# Patient Record
Sex: Male | Born: 1945 | Race: White | Hispanic: No | Marital: Married | Smoking: Never smoker
Health system: Southern US, Community
[De-identification: ages and names within clinical notes are randomized; demographics above are authoritative.]

## PROBLEM LIST (undated history)

## (undated) DIAGNOSIS — N289 Disorder of kidney and ureter, unspecified: Secondary | ICD-10-CM

## (undated) DIAGNOSIS — I10 Essential (primary) hypertension: Secondary | ICD-10-CM

## (undated) DIAGNOSIS — E785 Hyperlipidemia, unspecified: Secondary | ICD-10-CM

## (undated) DIAGNOSIS — I2510 Atherosclerotic heart disease of native coronary artery without angina pectoris: Secondary | ICD-10-CM

## (undated) DIAGNOSIS — E119 Type 2 diabetes mellitus without complications: Secondary | ICD-10-CM

## (undated) DIAGNOSIS — N186 End stage renal disease: Secondary | ICD-10-CM

## (undated) DIAGNOSIS — I219 Acute myocardial infarction, unspecified: Secondary | ICD-10-CM

## (undated) HISTORY — PX: OTHER SURGICAL HISTORY: SHX169

## (undated) HISTORY — PX: CARDIAC CATHETERIZATION: SHX172

---

## 2008-06-25 ENCOUNTER — Ambulatory Visit: Payer: Self-pay | Admitting: Family Medicine

## 2008-10-17 ENCOUNTER — Ambulatory Visit: Payer: Self-pay | Admitting: Internal Medicine

## 2013-09-23 DIAGNOSIS — Z85828 Personal history of other malignant neoplasm of skin: Secondary | ICD-10-CM | POA: Insufficient documentation

## 2017-11-23 ENCOUNTER — Encounter: Payer: Self-pay | Admitting: *Deleted

## 2017-11-23 ENCOUNTER — Encounter: Payer: No Typology Code available for payment source | Attending: Internal Medicine | Admitting: *Deleted

## 2017-11-23 VITALS — Ht 69.4 in | Wt 205.3 lb

## 2017-11-23 DIAGNOSIS — I214 Non-ST elevation (NSTEMI) myocardial infarction: Secondary | ICD-10-CM | POA: Insufficient documentation

## 2017-11-23 DIAGNOSIS — Z955 Presence of coronary angioplasty implant and graft: Secondary | ICD-10-CM | POA: Diagnosis not present

## 2017-11-23 DIAGNOSIS — Z79899 Other long term (current) drug therapy: Secondary | ICD-10-CM | POA: Diagnosis not present

## 2017-11-23 DIAGNOSIS — Z794 Long term (current) use of insulin: Secondary | ICD-10-CM | POA: Insufficient documentation

## 2017-11-23 DIAGNOSIS — K219 Gastro-esophageal reflux disease without esophagitis: Secondary | ICD-10-CM | POA: Insufficient documentation

## 2017-11-23 DIAGNOSIS — G4730 Sleep apnea, unspecified: Secondary | ICD-10-CM | POA: Insufficient documentation

## 2017-11-23 DIAGNOSIS — E119 Type 2 diabetes mellitus without complications: Secondary | ICD-10-CM | POA: Insufficient documentation

## 2017-11-23 DIAGNOSIS — I10 Essential (primary) hypertension: Secondary | ICD-10-CM | POA: Insufficient documentation

## 2017-11-23 DIAGNOSIS — N184 Chronic kidney disease, stage 4 (severe): Secondary | ICD-10-CM | POA: Insufficient documentation

## 2017-11-23 DIAGNOSIS — E781 Pure hyperglyceridemia: Secondary | ICD-10-CM | POA: Insufficient documentation

## 2017-11-23 DIAGNOSIS — I2510 Atherosclerotic heart disease of native coronary artery without angina pectoris: Secondary | ICD-10-CM | POA: Insufficient documentation

## 2017-11-23 NOTE — Progress Notes (Signed)
Cardiac Individual Treatment Plan  Patient Details  Name: Todd Barr MRN: [id number] Date of Birth: Jul 11, 1945 Referring Provider:     Cardiac Rehab from 11/23/2017 in [facility] Cardiac and Pulmonary Rehab  Referring Provider  Barbie Haggis MD ([place])      Initial Encounter Date:    Cardiac Rehab from 11/23/2017 in [facility] Cardiac and Pulmonary Rehab  Date  11/23/17      Visit Diagnosis: NSTEMI (non-ST elevated myocardial infarction) [facility])  Status post coronary artery stent placement  Patient's Home Medications on Admission:  Current Outpatient Medications:  .  amLODipine (NORVASC) 10 MG tablet, Take 10 mg by mouth daily., Disp: , Rfl:  .  aspirin 81 MG chewable tablet, Chew by mouth., Disp: , Rfl:  .  atorvastatin (LIPITOR) 80 MG tablet, Take 80 mg by mouth at bedtime., Disp: , Rfl:  .  calcitRIOL (ROCALTROL) 0.25 MCG capsule, Take 0.25 mcg by mouth daily., Disp: , Rfl:  .  calcium acetate (PHOSLO) 667 MG capsule, Take 1,334 mg by mouth 2 (two) times daily with a meal., Disp: , Rfl:  .  Cholecalciferol 1000 units tablet, Take 1,000 Units by mouth daily., Disp: , Rfl:  .  clopidogrel (PLAVIX) 75 MG tablet, Take 75 mg by mouth daily., Disp: , Rfl:  .  ferrous sulfate 325 (65 FE) MG EC tablet, Take 325 mg by mouth 3 (three) times daily with meals., Disp: , Rfl:  .  glucose blood test strip, 1 each by Other route as needed for other. Use as instructed, Disp: , Rfl:  .  hydrALAZINE (APRESOLINE) 50 MG tablet, Take 50 mg by mouth 3 (three) times daily., Disp: , Rfl:  .  insulin glargine (LANTUS) 100 UNIT/ML injection, Inject 25 Units into the skin 2 (two) times daily., Disp: , Rfl:  .  insulin regular (NOVOLIN R,HUMULIN R) 100 units/mL injection, Inject 15 Units into the skin 2 (two) times daily before a meal., Disp: , Rfl:  .  isosorbide mononitrate (IMDUR) 60 MG 24 hr tablet, Take 60 mg by mouth every morning., Disp: , Rfl:  .  magnesium oxide (MAG-OX) 400 MG tablet, Take 400 mg by mouth daily.,  Disp: , Rfl:  .  metoprolol (TOPROL-XL) 200 MG 24 hr tablet, Take 100 mg by mouth daily., Disp: , Rfl:  .  nitroGLYCERIN (NITROSTAT) 0.4 MG SL tablet, Place 0.4 mg under the tongue every 5 (five) minutes as needed for chest pain., Disp: , Rfl:  .  omeprazole (PRILOSEC) 20 MG capsule, Take 20 mg by mouth every morning., Disp: , Rfl:  .  ranitidine (ZANTAC) 150 MG tablet, Take 150 mg by mouth 2 (two) times daily as needed for heartburn., Disp: , Rfl:  .  simethicone (MYLICON) 80 MG chewable tablet, Chew 80 mg by mouth 3 (three) times daily as needed for flatulence., Disp: , Rfl:  .  sodium bicarbonate 650 MG tablet, Take 650 mg by mouth 2 (two) times daily., Disp: , Rfl:   Past Medical History: History reviewed. No pertinent past medical history.  Tobacco Use: Social History   Tobacco Use  Smoking Status Never Smoker  Smokeless Tobacco Never Used    Labs: Recent Review Flowsheet Data    There is no flowsheet data to display.       Exercise Target Goals: Exercise Program Goal: Individual exercise prescription set using results from initial 6 min walk test and THRR while considering  patient's activity barriers and safety.   Exercise Prescription Goal: Initial exercise prescription builds  to 30-45 minutes a day of aerobic activity, 2-3 days per week.  Home exercise guidelines will be given to patient during program as part of exercise prescription that the participant will acknowledge.  Activity Barriers & Risk Stratification: Activity Barriers & Cardiac Risk Stratification - 11/23/17 1251      Activity Barriers & Cardiac Risk Stratification   Activity Barriers  Arthritis;Neck/Spine Problems;Deconditioning;Muscular Weakness    Cardiac Risk Stratification  High       6 Minute Walk: 6 Minute Walk    Row Name 11/23/17 1427         6 Minute Walk   Phase  Initial     Distance  1115 feet     Walk Time  6 minutes     # of Rest Breaks  0     MPH  2.11     METS  2.89     RPE   12     VO2 Peak  10.09     Symptoms  No     Resting HR  75 bpm     Resting BP  146/60     Resting Oxygen Saturation   99 %     Exercise Oxygen Saturation  during 6 min walk  97 %     Max Ex. HR  119 bpm     Max Ex. BP  158/84     2 Minute Post BP  134/64        Oxygen Initial Assessment:   Oxygen Re-Evaluation:   Oxygen Discharge (Final Oxygen Re-Evaluation):   Initial Exercise Prescription: Initial Exercise Prescription - 11/23/17 1400      Date of Initial Exercise RX and Referring Provider   Date  11/23/17    Referring Provider  Barbie Haggis MD ([place])      Treadmill   MPH  2    Grade  0.5    Minutes  15    METs  2.67      NuStep   Level  2    SPM  80    Minutes  15    METs  2      Biostep-RELP   Level  2    SPM  50    Minutes  15    METs  2      Prescription Details   Frequency (times per week)  2    Duration  Progress to 30 minutes of continuous aerobic without signs/symptoms of physical distress      Intensity   THRR 40-80% of Max Heartrate  105-135    Ratings of Perceived Exertion  11-13    Perceived Dyspnea  0-4      Progression   Progression  Continue to progress workloads to maintain intensity without signs/symptoms of physical distress.      Resistance Training   Training Prescription  Yes    Weight  3 lbs    Reps  10-15       Perform Capillary Blood Glucose checks as needed.  Exercise Prescription Changes: Exercise Prescription Changes    Row Name 11/23/17 1300             Response to Exercise   Blood Pressure (Admit)  146/60       Blood Pressure (Exercise)  158/84       Blood Pressure (Exit)  134/64       Heart Rate (Admit)  75 bpm       Heart Rate (Exercise)  119 bpm  Heart Rate (Exit)  81 bpm       Oxygen Saturation (Admit)  99 %       Oxygen Saturation (Exercise)  97 %       Rating of Perceived Exertion (Exercise)  12       Symptoms  none       Comments  walk test results          Exercise  Comments:   Exercise Goals and Review: Exercise Goals    Row Name 11/23/17 1430             Exercise Goals   Increase Physical Activity  Yes       Intervention  Provide advice, education, support and counseling about physical activity/exercise needs.;Develop an individualized exercise prescription for aerobic and resistive training based on initial evaluation findings, risk stratification, comorbidities and participant's personal goals.       Expected Outcomes  Short Term: Attend rehab on a regular basis to increase amount of physical activity.;Long Term: Add in home exercise to make exercise part of routine and to increase amount of physical activity.;Long Term: Exercising regularly at least 3-5 days a week.       Increase Strength and Stamina  Yes       Intervention  Provide advice, education, support and counseling about physical activity/exercise needs.;Develop an individualized exercise prescription for aerobic and resistive training based on initial evaluation findings, risk stratification, comorbidities and participant's personal goals.       Expected Outcomes  Short Term: Increase workloads from initial exercise prescription for resistance, speed, and METs.;Short Term: Perform resistance training exercises routinely during rehab and add in resistance training at home;Long Term: Improve cardiorespiratory fitness, muscular endurance and strength as measured by increased METs and functional capacity (6MWT)       Able to understand and use rate of perceived exertion (RPE) scale  Yes       Intervention  Provide education and explanation on how to use RPE scale       Expected Outcomes  Short Term: Able to use RPE daily in rehab to express subjective intensity level;Long Term:  Able to use RPE to guide intensity level when exercising independently       Knowledge and understanding of Target Heart Rate Range (THRR)  Yes       Intervention  Provide education and explanation of THRR including how  the numbers were predicted and where they are located for reference       Expected Outcomes  Short Term: Able to use daily as guideline for intensity in rehab;Long Term: Able to use THRR to govern intensity when exercising independently;Short Term: Able to state/look up THRR       Able to check pulse independently  Yes       Intervention  Review the importance of being able to check your own pulse for safety during independent exercise;Provide education and demonstration on how to check pulse in carotid and radial arteries.       Expected Outcomes  Short Term: Able to explain why pulse checking is important during independent exercise;Long Term: Able to check pulse independently and accurately       Understanding of Exercise Prescription  Yes       Intervention  Provide education, explanation, and written materials on patient's individual exercise prescription       Expected Outcomes  Short Term: Able to explain program exercise prescription;Long Term: Able to explain home exercise prescription to exercise independently  Exercise Goals Re-Evaluation :   Discharge Exercise Prescription (Final Exercise Prescription Changes): Exercise Prescription Changes - 11/23/17 1300      Response to Exercise   Blood Pressure (Admit)  146/60    Blood Pressure (Exercise)  158/84    Blood Pressure (Exit)  134/64    Heart Rate (Admit)  75 bpm    Heart Rate (Exercise)  119 bpm    Heart Rate (Exit)  81 bpm    Oxygen Saturation (Admit)  99 %    Oxygen Saturation (Exercise)  97 %    Rating of Perceived Exertion (Exercise)  12    Symptoms  none    Comments  walk test results       Nutrition:  Target Goals: Understanding of nutrition guidelines, daily intake of sodium <155m, cholesterol <2082m calories 30% from fat and 7% or less from saturated fats, daily to have 5 or more servings of fruits and vegetables.  Biometrics:    Nutrition Therapy Plan and Nutrition Goals: Nutrition Therapy &  Goals - 11/23/17 1254      Intervention Plan   Intervention  Prescribe, educate and counsel regarding individualized specific dietary modifications aiming towards targeted core components such as weight, hypertension, lipid management, diabetes, heart failure and other comorbidities.;Nutrition handout(s) given to patient.    Expected Outcomes  Short Term Goal: Understand basic principles of dietary content, such as calories, fat, sodium, cholesterol and nutrients.;Short Term Goal: A plan has been developed with personal nutrition goals set during dietitian appointment.;Long Term Goal: Adherence to prescribed nutrition plan.       Nutrition Assessments: Nutrition Assessments - 11/23/17 1254      MEDFICTS Scores   Pre Score  52       Nutrition Goals Re-Evaluation:   Nutrition Goals Discharge (Final Nutrition Goals Re-Evaluation):   Psychosocial: Target Goals: Acknowledge presence or absence of significant depression and/or stress, maximize coping skills, provide positive support system. Participant is able to verbalize types and ability to use techniques and skills needed for reducing stress and depression.   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 This has really been hard. He reports a lot of his issues he does not know if it is coming from his recent heart attack or his failing kindeys.       Family Dynamics   Good Support System?  Yes   spouse     Screening Interventions   Interventions  Encouraged to exercise;Program counselor consult;To provide support and resources with identified psychosocial needs;Provide feedback about the scores to  participant    Expected Outcomes  Short Term goal: Utilizing psychosocial counselor, staff and physician to assist with identification of specific Stressors or current issues interfering with healing process. Setting desired goal for each stressor or current issue identified.;Long Term Goal: Stressors or current issues are controlled or eliminated.;Short Term goal: Identification and review with participant of any Quality of Life or Depression concerns found by scoring the questionnaire.;Long Term goal: The participant improves quality of Life and PHQ9 Scores as seen by post scores and/or verbalization of changes       Quality of Life Scores:  Quality of Life - 11/23/17 1253      Quality of Life   Select  Quality of Life      Quality of Life Scores   Health/Function Pre  19.33 %    Socioeconomic Pre  20.63 %    Psych/Spiritual Pre  24.29 %    Family Pre  30 %    GLOBAL Pre  22.14 %      Scores of 19 and below usually indicate a poorer quality of life in these areas.  A difference of  2-3 points is a clinically meaningful difference.  A difference of 2-3 points in the total score of the Quality of Life Index has been associated with significant improvement in overall quality of life, self-image, physical symptoms, and general health in studies assessing change in quality of life.  PHQ-9: Recent Review Flowsheet Data    Depression screen [facility] 2/9 11/23/2017   Decreased Interest 0   Down, Depressed, Hopeless 0   PHQ - 2 Score 0   Altered sleeping 0   Tired, decreased energy 2   Change in appetite 0   Feeling bad or failure about yourself  0   Trouble concentrating 0   Moving slowly or fidgety/restless 0   Suicidal thoughts 0   PHQ-9 Score 2   Difficult doing work/chores Somewhat difficult     Interpretation of Total Score  Total Score Depression Severity:  1-4 = Minimal depression, 5-9 = Mild depression, 10-14 = Moderate depression, 15-19 = Moderately severe depression, 20-27 =  Severe depression   Psychosocial Evaluation and Intervention:   Psychosocial Re-Evaluation:   Psychosocial Discharge (Final Psychosocial Re-Evaluation):   Vocational Rehabilitation: Provide vocational rehab assistance to qualifying candidates.   Vocational Rehab Evaluation & Intervention: Vocational Rehab - 11/23/17 1255      Initial Vocational Rehab Evaluation & Intervention   Assessment shows need for Vocational Rehabilitation  No       Education: Education Goals: Education classes will be provided on a variety of topics geared toward better understanding of heart health and risk factor modification. Participant will state understanding/return demonstration of topics presented as noted by education test scores.  Learning Barriers/Preferences: Learning Barriers/Preferences - 11/23/17 1254      Learning Barriers/Preferences   Learning Barriers  None    Learning Preferences  Individual Instruction;Written Material       Education Topics:  AED/CPR: - Group verbal and written instruction with the use of models to demonstrate the basic use of the AED with the basic ABC's of resuscitation.   General Nutrition Guidelines/Fats and Fiber: -Group instruction provided by verbal, written material, models and posters to present the general guidelines for heart healthy nutrition. Gives an explanation and review of dietary fats and fiber.   Controlling Sodium/Reading Food Labels: -Group verbal and written material supporting the discussion of sodium use in heart healthy nutrition. Review and explanation with models, verbal and written materials for utilization of the food label.   Exercise Physiology & General Exercise Guidelines: - Group verbal and written instruction with models to review the exercise physiology of the cardiovascular system and associated critical values. Provides general exercise guidelines with specific guidelines to those with heart or lung disease.     Aerobic Exercise & Resistance Training: - Gives group verbal and written instruction on the various components of exercise. Focuses on aerobic and resistive training programs and the benefits of this training and how to safely progress through these programs..   Flexibility, Balance, Mind/Body Relaxation:  Provides group verbal/written instruction on the benefits of flexibility and balance training, including mind/body exercise modes such as yoga, pilates and tai chi.  Demonstration and skill practice provided.   Stress and Anxiety: - Provides group verbal and written instruction about the health risks of elevated stress and causes of high stress.  Discuss the correlation between heart/lung disease and anxiety and treatment options. Review healthy ways to manage with stress and anxiety.   Depression: - Provides group verbal and written instruction on the correlation between heart/lung disease and depressed mood, treatment options, and the stigmas associated with seeking treatment.   Anatomy & Physiology of the Heart: - Group verbal and written instruction and models provide basic cardiac anatomy and physiology, with the coronary electrical and arterial systems. Review of Valvular disease and Heart Failure   Cardiac Procedures: - Group verbal and written instruction to review commonly prescribed medications for heart disease. Reviews the medication, class of the drug, and side effects. Includes the steps to properly store meds and maintain the prescription regimen. (beta blockers and nitrates)   Cardiac Medications I: - Group verbal and written instruction to review commonly prescribed medications for heart disease. Reviews the medication, class of the drug, and side effects. Includes the steps to properly store meds and maintain the prescription regimen.   Cardiac Medications II: -Group verbal and written instruction to review commonly prescribed medications for heart disease.  Reviews the medication, class of the drug, and side effects. (all other drug classes)    Go Sex-Intimacy & Heart Disease, Get SMART - Goal Setting: - Group verbal and written instruction through game format to discuss heart disease and the return to sexual intimacy. Provides group verbal and written material to discuss and apply goal setting through the application of the S.M.A.R.T. Method.   Other Matters of the Heart: - Provides group verbal, written materials and models to describe Stable Angina and Peripheral Artery. Includes description of the disease process and treatment options available to the cardiac patient.   Exercise & Equipment Safety: - Individual verbal instruction and demonstration of equipment use and safety with use of the equipment.   Cardiac Rehab from 11/23/2017 in [facility] Cardiac and Pulmonary Rehab  Date  11/23/17  Educator  [facility]  Instruction Review Code  1- Verbalizes Understanding      Infection Prevention: - Provides verbal and written material to individual with discussion of infection control including proper hand washing and proper equipment cleaning during exercise session.   Cardiac Rehab from 11/23/2017 in [facility] Cardiac and Pulmonary Rehab  Date  11/23/17  Educator  [facility]  Instruction Review Code  1- Verbalizes Understanding      Falls Prevention: - Provides verbal and written material to individual with discussion of falls prevention and safety.   Cardiac Rehab from 11/23/2017 in [facility] Cardiac and Pulmonary Rehab  Date  11/23/17  Educator  [facility]  Instruction Review Code  1- Verbalizes Understanding      Diabetes: - Individual verbal and written instruction to review signs/symptoms of diabetes, desired ranges of glucose level fasting, after meals and with exercise. Acknowledge that pre and post exercise glucose checks will be done for 3 sessions at entry of program.   Cardiac Rehab from 11/23/2017 in [facility] Cardiac and Pulmonary Rehab  Date  11/23/17  Educator   [facility]  Instruction Review Code  1- Verbalizes Understanding      Know Your Numbers and Risk Factors: -Group verbal and written instruction about important numbers in your health.  Discussion of what are risk factors and how they play a role in the disease process.  Review of Cholesterol, Blood Pressure, Diabetes, and BMI and the role they play in your overall health.   Sleep Hygiene: -Provides group verbal and written instruction about how sleep can affect your health.  Define sleep hygiene, discuss sleep cycles and impact of sleep habits. Review good sleep hygiene tips.    Other: -Provides group and verbal instruction on various topics (see comments)   Knowledge Questionnaire Score: Knowledge Questionnaire Score - 11/23/17 1254      Knowledge Questionnaire Score   Pre Score  25/26   correct answers reviewed with patient, focus on exercise      Core Components/Risk Factors/Patient Goals at Admission: Personal Goals and Risk Factors at Admission - 11/23/17 1255      Core Components/Risk Factors/Patient Goals on Admission    Weight Management  Yes;Weight Loss    Intervention  Weight Management: Develop a combined nutrition and exercise program designed to reach desired caloric intake, while maintaining appropriate intake of nutrient and fiber, sodium and fats, and appropriate energy expenditure required for the weight goal.;Weight Management: Provide education and appropriate resources to help participant work on and attain dietary goals.;Weight Management/Obesity: Establish reasonable short term and long term weight goals.    Admit Weight  205 lb (93 kg)    Goal Weight: Short Term  200 lb (90.7 kg)    Goal Weight: Long Term  200 lb (90.7 kg)    Expected Outcomes  Short Term: Continue to assess and modify interventions until short term weight is achieved;Long Term: Adherence to nutrition and physical activity/exercise program aimed toward attainment of established weight goal;Weight  Loss: Understanding of general recommendations for a balanced deficit meal plan, which promotes 1-2 lb weight loss per week and includes a negative energy balance of [phone] kcal/d;Understanding recommendations for meals to include 15-35% energy as protein, 25-35% energy from fat, 35-60% energy from carbohydrates, less than 293m of dietary cholesterol, 20-35 gm of total fiber daily;Understanding of distribution of calorie intake throughout the day with the consumption of 4-5 meals/snacks    Diabetes  Yes    Intervention  Provide education about signs/symptoms and action to take for hypo/hyperglycemia.;Provide education about proper nutrition, including hydration, and aerobic/resistive exercise prescription along with prescribed medications to achieve blood glucose in normal ranges: Fasting glucose 65-99 mg/dL    Expected Outcomes  Short Term: Participant verbalizes understanding of the signs/symptoms and immediate care of hyper/hypoglycemia, proper foot care and importance of medication, aerobic/resistive exercise and nutrition plan for blood glucose control.;Long Term: Attainment of HbA1C < 7%.    Hypertension  Yes    Intervention  Monitor prescription use compliance.;Provide education on lifestyle modifcations including regular physical activity/exercise, weight management, moderate sodium restriction and increased consumption of fresh fruit, vegetables, and low fat dairy, alcohol moderation, and smoking cessation.    Expected Outcomes  Short Term: Continued assessment and intervention until BP is < 140/932mHG in hypertensive participants. < 130/8037mG in hypertensive participants with diabetes, heart failure or chronic kidney disease.;Long Term: Maintenance of blood pressure at goal levels.    Lipids  Yes    Intervention  Provide education and support for participant on nutrition & aerobic/resistive exercise along with prescribed medications to achieve LDL <75m56mDL >40mg3m Expected Outcomes   Short Term: Participant states understanding of desired cholesterol values and is compliant with medications prescribed. Participant is following exercise prescription and nutrition guidelines.;Long Term:  Cholesterol controlled with medications as prescribed, with individualized exercise RX and with personalized nutrition plan. Value goals: LDL < 18m, HDL > 40 mg.       Core Components/Risk Factors/Patient Goals Review:    Core Components/Risk Factors/Patient Goals at Discharge (Final Review):    ITP Comments: ITP Comments    Row Name 11/23/17 1216           ITP Comments  Med Review completed. Initial ITP created. [place]          Comments: Initial ITP

## 2017-11-23 NOTE — Patient Instructions (Signed)
Patient Instructions  Patient Details  Name: Todd Barr MRN: [id number] Date of Birth: April 22, 1945 Referring Provider:  Mliss Sax, MD  Below are your personal goals for exercise, nutrition, and risk factors. Our goal is to help you stay on track towards obtaining and maintaining these goals. We will be discussing your progress on these goals with you throughout the program.  Initial Exercise Prescription: Initial Exercise Prescription - 11/23/17 1400      Date of Initial Exercise RX and Referring Provider   Date  11/23/17    Referring Provider  Barbie Haggis [place] ([facility])      Treadmill   MPH  2    Grade  0.5    Minutes  15    METs  2.67      NuStep   Level  2    SPM  80    Minutes  15    METs  2      Biostep-RELP   Level  2    SPM  50    Minutes  15    METs  2      Prescription Details   Frequency (times per week)  2    Duration  Progress to 30 minutes of continuous aerobic without signs/symptoms of physical distress      Intensity   THRR 40-80% of Max Heartrate  105-135    Ratings of Perceived Exertion  11-13    Perceived Dyspnea  0-4      Progression   Progression  Continue to progress workloads to maintain intensity without signs/symptoms of physical distress.      Resistance Training   Training Prescription  Yes    Weight  3 lbs    Reps  10-15       Exercise Goals: Frequency: Be able to perform aerobic exercise two to three times per week in program working toward 2-5 days per week of home exercise.  Intensity: Work with a perceived exertion of 11 (fairly light) - 15 (hard) while following your exercise prescription.  We will make changes to your prescription with you as you progress through the program.   Duration: Be able to do 30 to 45 minutes of continuous aerobic exercise in addition to a 5 minute warm-up and a 5 minute cool-down routine.   Nutrition Goals: Your personal nutrition goals will be established when you do your nutrition analysis with  the dietician.  The following are general nutrition guidelines to follow: Cholesterol < 200mg /day Sodium < 1500mg /day Fiber: Men over 50 yrs - 30 grams per day  Personal Goals: Personal Goals and Risk Factors at Admission - 11/23/17 1255      Core Components/Risk Factors/Patient Goals on Admission    Weight Management  Yes;Weight Loss    Intervention  Weight Management: Develop a combined nutrition and exercise program designed to reach desired caloric intake, while maintaining appropriate intake of nutrient and fiber, sodium and fats, and appropriate energy expenditure required for the weight goal.;Weight Management: Provide education and appropriate resources to help participant work on and attain dietary goals.;Weight Management/Obesity: Establish reasonable short term and long term weight goals.    Admit Weight  205 lb (93 kg)    Goal Weight: Short Term  200 lb (90.7 kg)    Goal Weight: Long Term  200 lb (90.7 kg)    Expected Outcomes  Short Term: Continue to assess and modify interventions until short term weight is achieved;Long Term: Adherence to nutrition and physical activity/exercise program aimed  toward attainment of established weight goal;Weight Loss: Understanding of general recommendations for a balanced deficit meal plan, which promotes 1-2 lb weight loss per week and includes a negative energy balance of [phone] kcal/d;Understanding recommendations for meals to include 15-35% energy as protein, 25-35% energy from fat, 35-60% energy from carbohydrates, less than 200mg  of dietary cholesterol, 20-35 gm of total fiber daily;Understanding of distribution of calorie intake throughout the day with the consumption of 4-5 meals/snacks    Diabetes  Yes    Intervention  Provide education about signs/symptoms and action to take for hypo/hyperglycemia.;Provide education about proper nutrition, including hydration, and aerobic/resistive exercise prescription along with prescribed medications to  achieve blood glucose in normal ranges: Fasting glucose 65-99 mg/dL    Expected Outcomes  Short Term: Participant verbalizes understanding of the signs/symptoms and immediate care of hyper/hypoglycemia, proper foot care and importance of medication, aerobic/resistive exercise and nutrition plan for blood glucose control.;Long Term: Attainment of HbA1C < 7%.    Hypertension  Yes    Intervention  Monitor prescription use compliance.;Provide education on lifestyle modifcations including regular physical activity/exercise, weight management, moderate sodium restriction and increased consumption of fresh fruit, vegetables, and low fat dairy, alcohol moderation, and smoking cessation.    Expected Outcomes  Short Term: Continued assessment and intervention until BP is < 140/10mm HG in hypertensive participants. < 130/28mm HG in hypertensive participants with diabetes, heart failure or chronic kidney disease.;Long Term: Maintenance of blood pressure at goal levels.    Lipids  Yes    Intervention  Provide education and support for participant on nutrition & aerobic/resistive exercise along with prescribed medications to achieve LDL 70mg , HDL >40mg .    Expected Outcomes  Short Term: Participant states understanding of desired cholesterol values and is compliant with medications prescribed. Participant is following exercise prescription and nutrition guidelines.;Long Term: Cholesterol controlled with medications as prescribed, with individualized exercise RX and with personalized nutrition plan. Value goals: LDL < 70mg , HDL > 40 mg.       Tobacco Use Initial Evaluation: Social History   Tobacco Use  Smoking Status Never Smoker  Smokeless Tobacco Never Used    Exercise Goals and Review: Exercise Goals    Row Name 11/23/17 1430             Exercise Goals   Increase Physical Activity  Yes       Intervention  Provide advice, education, support and counseling about physical activity/exercise  needs.;Develop an individualized exercise prescription for aerobic and resistive training based on initial evaluation findings, risk stratification, comorbidities and participant's personal goals.       Expected Outcomes  Short Term: Attend rehab on a regular basis to increase amount of physical activity.;Long Term: Add in home exercise to make exercise part of routine and to increase amount of physical activity.;Long Term: Exercising regularly at least 3-5 days a week.       Increase Strength and Stamina  Yes       Intervention  Provide advice, education, support and counseling about physical activity/exercise needs.;Develop an individualized exercise prescription for aerobic and resistive training based on initial evaluation findings, risk stratification, comorbidities and participant's personal goals.       Expected Outcomes  Short Term: Increase workloads from initial exercise prescription for resistance, speed, and METs.;Short Term: Perform resistance training exercises routinely during rehab and add in resistance training at home;Long Term: Improve cardiorespiratory fitness, muscular endurance and strength as measured by increased METs and functional capacity (6MWT)  Able to understand and use rate of perceived exertion (RPE) scale  Yes       Intervention  Provide education and explanation on how to use RPE scale       Expected Outcomes  Short Term: Able to use RPE daily in rehab to express subjective intensity level;Long Term:  Able to use RPE to guide intensity level when exercising independently       Knowledge and understanding of Target Heart Rate Range (THRR)  Yes       Intervention  Provide education and explanation of THRR including how the numbers were predicted and where they are located for reference       Expected Outcomes  Short Term: Able to use daily as guideline for intensity in rehab;Long Term: Able to use THRR to govern intensity when exercising independently;Short Term: Able  to state/look up THRR       Able to check pulse independently  Yes       Intervention  Review the importance of being able to check your own pulse for safety during independent exercise;Provide education and demonstration on how to check pulse in carotid and radial arteries.       Expected Outcomes  Short Term: Able to explain why pulse checking is important during independent exercise;Long Term: Able to check pulse independently and accurately       Understanding of Exercise Prescription  Yes       Intervention  Provide education, explanation, and written materials on patient's individual exercise prescription       Expected Outcomes  Short Term: Able to explain program exercise prescription;Long Term: Able to explain home exercise prescription to exercise independently          Copy of goals given to participant.

## 2017-11-25 ENCOUNTER — Encounter: Payer: Self-pay | Admitting: *Deleted

## 2017-11-25 DIAGNOSIS — I214 Non-ST elevation (NSTEMI) myocardial infarction: Secondary | ICD-10-CM

## 2017-11-25 DIAGNOSIS — Z955 Presence of coronary angioplasty implant and graft: Secondary | ICD-10-CM

## 2017-11-25 NOTE — Progress Notes (Signed)
Cardiac Individual Treatment Plan  Patient Details  Name: Todd Barr MRN: [id number] Date of Birth: 24-Jan-1946 Referring Provider:     Cardiac Rehab from 11/23/2017 in [facility] Cardiac and Pulmonary Rehab  Referring Provider  Barbie Haggis MD ([place])      Initial Encounter Date:    Cardiac Rehab from 11/23/2017 in [facility] Cardiac and Pulmonary Rehab  Date  11/23/17      Visit Diagnosis: NSTEMI (non-ST elevated myocardial infarction) [facility])  Status post coronary artery stent placement  Patient's Home Medications on Admission:  Current Outpatient Medications:  .  amLODipine (NORVASC) 10 MG tablet, Take 10 mg by mouth daily., Disp: , Rfl:  .  aspirin 81 MG chewable tablet, Chew by mouth., Disp: , Rfl:  .  atorvastatin (LIPITOR) 80 MG tablet, Take 80 mg by mouth at bedtime., Disp: , Rfl:  .  calcitRIOL (ROCALTROL) 0.25 MCG capsule, Take 0.25 mcg by mouth daily., Disp: , Rfl:  .  calcium acetate (PHOSLO) 667 MG capsule, Take 1,334 mg by mouth 2 (two) times daily with a meal., Disp: , Rfl:  .  Cholecalciferol 1000 units tablet, Take 1,000 Units by mouth daily., Disp: , Rfl:  .  clopidogrel (PLAVIX) 75 MG tablet, Take 75 mg by mouth daily., Disp: , Rfl:  .  ferrous sulfate 325 (65 FE) MG EC tablet, Take 325 mg by mouth 3 (three) times daily with meals., Disp: , Rfl:  .  glucose blood test strip, 1 each by Other route as needed for other. Use as instructed, Disp: , Rfl:  .  hydrALAZINE (APRESOLINE) 50 MG tablet, Take 50 mg by mouth 3 (three) times daily., Disp: , Rfl:  .  insulin glargine (LANTUS) 100 UNIT/ML injection, Inject 25 Units into the skin 2 (two) times daily., Disp: , Rfl:  .  insulin regular (NOVOLIN R,HUMULIN R) 100 units/mL injection, Inject 15 Units into the skin 2 (two) times daily before a meal., Disp: , Rfl:  .  isosorbide mononitrate (IMDUR) 60 MG 24 hr tablet, Take 60 mg by mouth every morning., Disp: , Rfl:  .  magnesium oxide (MAG-OX) 400 MG tablet, Take 400 mg by mouth daily.,  Disp: , Rfl:  .  metoprolol (TOPROL-XL) 200 MG 24 hr tablet, Take 100 mg by mouth daily., Disp: , Rfl:  .  nitroGLYCERIN (NITROSTAT) 0.4 MG SL tablet, Place 0.4 mg under the tongue every 5 (five) minutes as needed for chest pain., Disp: , Rfl:  .  omeprazole (PRILOSEC) 20 MG capsule, Take 20 mg by mouth every morning., Disp: , Rfl:  .  ranitidine (ZANTAC) 150 MG tablet, Take 150 mg by mouth 2 (two) times daily as needed for heartburn., Disp: , Rfl:  .  simethicone (MYLICON) 80 MG chewable tablet, Chew 80 mg by mouth 3 (three) times daily as needed for flatulence., Disp: , Rfl:  .  sodium bicarbonate 650 MG tablet, Take 650 mg by mouth 2 (two) times daily., Disp: , Rfl:   Past Medical History: No past medical history on file.  Tobacco Use: Social History   Tobacco Use  Smoking Status Never Smoker  Smokeless Tobacco Never Used    Labs: Recent Review Flowsheet Data    There is no flowsheet data to display.       Exercise Target Goals: Exercise Program Goal: Individual exercise prescription set using results from initial 6 min walk test and THRR while considering  patient's activity barriers and safety.   Exercise Prescription Goal: Initial exercise prescription builds to  30-45 minutes a day of aerobic activity, 2-3 days per week.  Home exercise guidelines will be given to patient during program as part of exercise prescription that the participant will acknowledge.  Activity Barriers & Risk Stratification: Activity Barriers & Cardiac Risk Stratification - 11/23/17 1251      Activity Barriers & Cardiac Risk Stratification   Activity Barriers  Arthritis;Neck/Spine Problems;Deconditioning;Muscular Weakness    Cardiac Risk Stratification  High       6 Minute Walk: 6 Minute Walk    Row Name 11/23/17 1427         6 Minute Walk   Phase  Initial     Distance  1115 feet     Walk Time  6 minutes     # of Rest Breaks  0     MPH  2.11     METS  2.89     RPE  12     VO2 Peak   10.09     Symptoms  No     Resting HR  75 bpm     Resting BP  146/60     Resting Oxygen Saturation   99 %     Exercise Oxygen Saturation  during 6 min walk  97 %     Max Ex. HR  119 bpm     Max Ex. BP  158/84     2 Minute Post BP  134/64        Oxygen Initial Assessment:   Oxygen Re-Evaluation:   Oxygen Discharge (Final Oxygen Re-Evaluation):   Initial Exercise Prescription: Initial Exercise Prescription - 11/23/17 1400      Date of Initial Exercise RX and Referring Provider   Date  11/23/17    Referring Provider  Barbie Haggis MD ([place])      Treadmill   MPH  2    Grade  0.5    Minutes  15    METs  2.67      NuStep   Level  2    SPM  80    Minutes  15    METs  2      Biostep-RELP   Level  2    SPM  50    Minutes  15    METs  2      Prescription Details   Frequency (times per week)  2    Duration  Progress to 30 minutes of continuous aerobic without signs/symptoms of physical distress      Intensity   THRR 40-80% of Max Heartrate  105-135    Ratings of Perceived Exertion  11-13    Perceived Dyspnea  0-4      Progression   Progression  Continue to progress workloads to maintain intensity without signs/symptoms of physical distress.      Resistance Training   Training Prescription  Yes    Weight  3 lbs    Reps  10-15       Perform Capillary Blood Glucose checks as needed.  Exercise Prescription Changes: Exercise Prescription Changes    Row Name 11/23/17 1300             Response to Exercise   Blood Pressure (Admit)  146/60       Blood Pressure (Exercise)  158/84       Blood Pressure (Exit)  134/64       Heart Rate (Admit)  75 bpm       Heart Rate (Exercise)  119 bpm  Heart Rate (Exit)  81 bpm       Oxygen Saturation (Admit)  99 %       Oxygen Saturation (Exercise)  97 %       Rating of Perceived Exertion (Exercise)  12       Symptoms  none       Comments  walk test results          Exercise Comments:   Exercise Goals and  Review: Exercise Goals    Row Name 11/23/17 1430             Exercise Goals   Increase Physical Activity  Yes       Intervention  Provide advice, education, support and counseling about physical activity/exercise needs.;Develop an individualized exercise prescription for aerobic and resistive training based on initial evaluation findings, risk stratification, comorbidities and participant's personal goals.       Expected Outcomes  Short Term: Attend rehab on a regular basis to increase amount of physical activity.;Long Term: Add in home exercise to make exercise part of routine and to increase amount of physical activity.;Long Term: Exercising regularly at least 3-5 days a week.       Increase Strength and Stamina  Yes       Intervention  Provide advice, education, support and counseling about physical activity/exercise needs.;Develop an individualized exercise prescription for aerobic and resistive training based on initial evaluation findings, risk stratification, comorbidities and participant's personal goals.       Expected Outcomes  Short Term: Increase workloads from initial exercise prescription for resistance, speed, and METs.;Short Term: Perform resistance training exercises routinely during rehab and add in resistance training at home;Long Term: Improve cardiorespiratory fitness, muscular endurance and strength as measured by increased METs and functional capacity (6MWT)       Able to understand and use rate of perceived exertion (RPE) scale  Yes       Intervention  Provide education and explanation on how to use RPE scale       Expected Outcomes  Short Term: Able to use RPE daily in rehab to express subjective intensity level;Long Term:  Able to use RPE to guide intensity level when exercising independently       Knowledge and understanding of Target Heart Rate Range (THRR)  Yes       Intervention  Provide education and explanation of THRR including how the numbers were predicted and  where they are located for reference       Expected Outcomes  Short Term: Able to use daily as guideline for intensity in rehab;Long Term: Able to use THRR to govern intensity when exercising independently;Short Term: Able to state/look up THRR       Able to check pulse independently  Yes       Intervention  Review the importance of being able to check your own pulse for safety during independent exercise;Provide education and demonstration on how to check pulse in carotid and radial arteries.       Expected Outcomes  Short Term: Able to explain why pulse checking is important during independent exercise;Long Term: Able to check pulse independently and accurately       Understanding of Exercise Prescription  Yes       Intervention  Provide education, explanation, and written materials on patient's individual exercise prescription       Expected Outcomes  Short Term: Able to explain program exercise prescription;Long Term: Able to explain home exercise prescription to exercise independently  Exercise Goals Re-Evaluation :   Discharge Exercise Prescription (Final Exercise Prescription Changes): Exercise Prescription Changes - 11/23/17 1300      Response to Exercise   Blood Pressure (Admit)  146/60    Blood Pressure (Exercise)  158/84    Blood Pressure (Exit)  134/64    Heart Rate (Admit)  75 bpm    Heart Rate (Exercise)  119 bpm    Heart Rate (Exit)  81 bpm    Oxygen Saturation (Admit)  99 %    Oxygen Saturation (Exercise)  97 %    Rating of Perceived Exertion (Exercise)  12    Symptoms  none    Comments  walk test results       Nutrition:  Target Goals: Understanding of nutrition guidelines, daily intake of sodium '1500mg'$ , cholesterol '200mg'$ , calories 30% from fat and 7% or less from saturated fats, daily to have 5 or more servings of fruits and vegetables.  Biometrics: Pre Biometrics - 11/23/17 1431      Pre Biometrics   Height  5' 9.4" (1.763 m)    Weight  205 lb 4.8  oz (93.1 kg)    Waist Circumference  42.5 inches    Hip Circumference  41.5 inches    Waist to Hip Ratio  1.02 %    BMI (Calculated)  29.96    Single Leg Stand  14.05 seconds        Nutrition Therapy Plan and Nutrition Goals: Nutrition Therapy & Goals - 11/23/17 1254      Intervention Plan   Intervention  Prescribe, educate and counsel regarding individualized specific dietary modifications aiming towards targeted core components such as weight, hypertension, lipid management, diabetes, heart failure and other comorbidities.;Nutrition handout(s) given to patient.    Expected Outcomes  Short Term Goal: Understand basic principles of dietary content, such as calories, fat, sodium, cholesterol and nutrients.;Short Term Goal: A plan has been developed with personal nutrition goals set during dietitian appointment.;Long Term Goal: Adherence to prescribed nutrition plan.       Nutrition Assessments: Nutrition Assessments - 11/23/17 1254      MEDFICTS Scores   Pre Score  52       Nutrition Goals Re-Evaluation:   Nutrition Goals Discharge (Final Nutrition Goals Re-Evaluation):   Psychosocial: Target Goals: Acknowledge presence or absence of significant depression and/or stress, maximize coping skills, provide positive support system. Participant is able to verbalize types and ability to use techniques and skills needed for reducing stress and depression.   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 This has really been hard. He reports a lot of his issues he does not know if it is coming from his recent heart attack or his failing kindeys.       Family  Dynamics   Good Support System?  Yes   spouse     Screening Interventions   Interventions  Encouraged to exercise;Program counselor consult;To provide support and resources with identified psychosocial needs;Provide feedback about the scores to participant    Expected Outcomes  Short Term goal: Utilizing psychosocial counselor, staff and physician to assist with identification of specific Stressors  or current issues interfering with healing process. Setting desired goal for each stressor or current issue identified.;Long Term Goal: Stressors or current issues are controlled or eliminated.;Short Term goal: Identification and review with participant of any Quality of Life or Depression concerns found by scoring the questionnaire.;Long Term goal: The participant improves quality of Life and PHQ9 Scores as seen by post scores and/or verbalization of changes       Quality of Life Scores:  Quality of Life - 11/23/17 1253      Quality of Life   Select  Quality of Life      Quality of Life Scores   Health/Function Pre  19.33 %    Socioeconomic Pre  20.63 %    Psych/Spiritual Pre  24.29 %    Family Pre  30 %    GLOBAL Pre  22.14 %      Scores of 19 and below usually indicate a poorer quality of life in these areas.  A difference of  2-3 points is a clinically meaningful difference.  A difference of 2-3 points in the total score of the Quality of Life Index has been associated with significant improvement in overall quality of life, self-image, physical symptoms, and general health in studies assessing change in quality of life.  PHQ-9: Recent Review Flowsheet Data    Depression screen [facility] 2/9 11/23/2017   Decreased Interest 0   Down, Depressed, Hopeless 0   PHQ - 2 Score 0   Altered sleeping 0   Tired, decreased energy 2   Change in appetite 0   Feeling bad or failure about yourself  0   Trouble concentrating 0   Moving slowly or fidgety/restless 0   Suicidal thoughts 0   PHQ-9 Score 2    Difficult doing work/chores Somewhat difficult     Interpretation of Total Score  Total Score Depression Severity:  1-4 = Minimal depression, 5-9 = Mild depression, 10-14 = Moderate depression, 15-19 = Moderately severe depression, 20-27 = Severe depression   Psychosocial Evaluation and Intervention:   Psychosocial Re-Evaluation:   Psychosocial Discharge (Final Psychosocial Re-Evaluation):   Vocational Rehabilitation: Provide vocational rehab assistance to qualifying candidates.   Vocational Rehab Evaluation & Intervention: Vocational Rehab - 11/23/17 1255      Initial Vocational Rehab Evaluation & Intervention   Assessment shows need for Vocational Rehabilitation  No       Education: Education Goals: Education classes will be provided on a variety of topics geared toward better understanding of heart health and risk factor modification. Participant will state understanding/return demonstration of topics presented as noted by education test scores.  Learning Barriers/Preferences: Learning Barriers/Preferences - 11/23/17 1254      Learning Barriers/Preferences   Learning Barriers  None    Learning Preferences  Individual Instruction;Written Material       Education Topics:  AED/CPR: - Group verbal and written instruction with the use of models to demonstrate the basic use of the AED with the basic ABC's of resuscitation.   General Nutrition Guidelines/Fats and Fiber: -Group instruction provided by verbal, written material, models and posters to present the general guidelines for heart healthy nutrition. Gives an explanation and review of dietary fats and fiber.   Controlling Sodium/Reading Food Labels: -Group verbal and written material supporting the discussion of sodium use in heart healthy nutrition. Review and explanation with models, verbal and written materials for utilization of the food label.   Exercise Physiology & General Exercise Guidelines: - Group  verbal and written  instruction with models to review the exercise physiology of the cardiovascular system and associated critical values. Provides general exercise guidelines with specific guidelines to those with heart or lung disease.    Aerobic Exercise & Resistance Training: - Gives group verbal and written instruction on the various components of exercise. Focuses on aerobic and resistive training programs and the benefits of this training and how to safely progress through these programs..   Flexibility, Balance, Mind/Body Relaxation: Provides group verbal/written instruction on the benefits of flexibility and balance training, including mind/body exercise modes such as yoga, pilates and tai chi.  Demonstration and skill practice provided.   Stress and Anxiety: - Provides group verbal and written instruction about the health risks of elevated stress and causes of high stress.  Discuss the correlation between heart/lung disease and anxiety and treatment options. Review healthy ways to manage with stress and anxiety.   Depression: - Provides group verbal and written instruction on the correlation between heart/lung disease and depressed mood, treatment options, and the stigmas associated with seeking treatment.   Anatomy & Physiology of the Heart: - Group verbal and written instruction and models provide basic cardiac anatomy and physiology, with the coronary electrical and arterial systems. Review of Valvular disease and Heart Failure   Cardiac Procedures: - Group verbal and written instruction to review commonly prescribed medications for heart disease. Reviews the medication, class of the drug, and side effects. Includes the steps to properly store meds and maintain the prescription regimen. (beta blockers and nitrates)   Cardiac Medications I: - Group verbal and written instruction to review commonly prescribed medications for heart disease. Reviews the medication, class of the  drug, and side effects. Includes the steps to properly store meds and maintain the prescription regimen.   Cardiac Medications II: -Group verbal and written instruction to review commonly prescribed medications for heart disease. Reviews the medication, class of the drug, and side effects. (all other drug classes)    Go Sex-Intimacy & Heart Disease, Get SMART - Goal Setting: - Group verbal and written instruction through game format to discuss heart disease and the return to sexual intimacy. Provides group verbal and written material to discuss and apply goal setting through the application of the S.M.A.R.T. Method.   Other Matters of the Heart: - Provides group verbal, written materials and models to describe Stable Angina and Peripheral Artery. Includes description of the disease process and treatment options available to the cardiac patient.   Exercise & Equipment Safety: - Individual verbal instruction and demonstration of equipment use and safety with use of the equipment.   Cardiac Rehab from 11/23/2017 in [facility] Cardiac and Pulmonary Rehab  Date  11/23/17  Educator  [facility]  Instruction Review Code  1- Verbalizes Understanding      Infection Prevention: - Provides verbal and written material to individual with discussion of infection control including proper hand washing and proper equipment cleaning during exercise session.   Cardiac Rehab from 11/23/2017 in [facility] Cardiac and Pulmonary Rehab  Date  11/23/17  Educator  [facility]  Instruction Review Code  1- Verbalizes Understanding      Falls Prevention: - Provides verbal and written material to individual with discussion of falls prevention and safety.   Cardiac Rehab from 11/23/2017 in [facility] Cardiac and Pulmonary Rehab  Date  11/23/17  Educator  [facility]  Instruction Review Code  1- Verbalizes Understanding      Diabetes: - Individual verbal and written instruction to review signs/symptoms of diabetes, desired ranges of  glucose level  fasting, after meals and with exercise. Acknowledge that pre and post exercise glucose checks will be done for 3 sessions at entry of program.   Cardiac Rehab from 11/23/2017 in [facility] Cardiac and Pulmonary Rehab  Date  11/23/17  Educator  [facility]  Instruction Review Code  1- Verbalizes Understanding      Know Your Numbers and Risk Factors: -Group verbal and written instruction about important numbers in your health.  Discussion of what are risk factors and how they play a role in the disease process.  Review of Cholesterol, Blood Pressure, Diabetes, and BMI and the role they play in your overall health.   Sleep Hygiene: -Provides group verbal and written instruction about how sleep can affect your health.  Define sleep hygiene, discuss sleep cycles and impact of sleep habits. Review good sleep hygiene tips.    Other: -Provides group and verbal instruction on various topics (see comments)   Knowledge Questionnaire Score: Knowledge Questionnaire Score - 11/23/17 1254      Knowledge Questionnaire Score   Pre Score  25/26   correct answers reviewed with patient, focus on exercise      Core Components/Risk Factors/Patient Goals at Admission: Personal Goals and Risk Factors at Admission - 11/23/17 1255      Core Components/Risk Factors/Patient Goals on Admission    Weight Management  Yes;Weight Loss    Intervention  Weight Management: Develop a combined nutrition and exercise program designed to reach desired caloric intake, while maintaining appropriate intake of nutrient and fiber, sodium and fats, and appropriate energy expenditure required for the weight goal.;Weight Management: Provide education and appropriate resources to help participant work on and attain dietary goals.;Weight Management/Obesity: Establish reasonable short term and long term weight goals.    Admit Weight  205 lb (93 kg)    Goal Weight: Short Term  200 lb (90.7 kg)    Goal Weight: Long Term  200 lb (90.7 kg)     Expected Outcomes  Short Term: Continue to assess and modify interventions until short term weight is achieved;Long Term: Adherence to nutrition and physical activity/exercise program aimed toward attainment of established weight goal;Weight Loss: Understanding of general recommendations for a balanced deficit meal plan, which promotes 1-2 lb weight loss per week and includes a negative energy balance of [id number] kcal/d;Understanding recommendations for meals to include 15-35% energy as protein, 25-35% energy from fat, 35-60% energy from carbohydrates, less than '200mg'$  of dietary cholesterol, 20-35 gm of total fiber daily;Understanding of distribution of calorie intake throughout the day with the consumption of 4-5 meals/snacks    Diabetes  Yes    Intervention  Provide education about signs/symptoms and action to take for hypo/hyperglycemia.;Provide education about proper nutrition, including hydration, and aerobic/resistive exercise prescription along with prescribed medications to achieve blood glucose in normal ranges: Fasting glucose 65-99 mg/dL    Expected Outcomes  Short Term: Participant verbalizes understanding of the signs/symptoms and immediate care of hyper/hypoglycemia, proper foot care and importance of medication, aerobic/resistive exercise and nutrition plan for blood glucose control.;Long Term: Attainment of HbA1C < 7%.    Hypertension  Yes    Intervention  Monitor prescription use compliance.;Provide education on lifestyle modifcations including regular physical activity/exercise, weight management, moderate sodium restriction and increased consumption of fresh fruit, vegetables, and low fat dairy, alcohol moderation, and smoking cessation.    Expected Outcomes  Short Term: Continued assessment and intervention until BP is < 140/64m HG in hypertensive participants. < 130/859mHG in hypertensive participants with  diabetes, heart failure or chronic kidney disease.;Long Term: Maintenance of blood  pressure at goal levels.    Lipids  Yes    Intervention  Provide education and support for participant on nutrition & aerobic/resistive exercise along with prescribed medications to achieve LDL '70mg'$ , HDL >'40mg'$ .    Expected Outcomes  Short Term: Participant states understanding of desired cholesterol values and is compliant with medications prescribed. Participant is following exercise prescription and nutrition guidelines.;Long Term: Cholesterol controlled with medications as prescribed, with individualized exercise RX and with personalized nutrition plan. Value goals: LDL < '70mg'$ , HDL > 40 mg.       Core Components/Risk Factors/Patient Goals Review:    Core Components/Risk Factors/Patient Goals at Discharge (Final Review):    ITP Comments: ITP Comments    Row Name 11/23/17 1216 11/25/17 0817         ITP Comments  Med Review completed. Initial ITP created. Diagnosis can be found in Media Tab from [place] 7/9  30 day review completed. ITP sent to Dr. Ramonita Lab, covering for Dr. Emily Filbert, Medical Director of Cardiac Rehab. Continue with ITP unless changes are made by physician.  New to program.  Has not started to exercise yet.          Comments:30 day review

## 2017-11-26 ENCOUNTER — Encounter: Payer: No Typology Code available for payment source | Admitting: *Deleted

## 2017-11-26 DIAGNOSIS — I214 Non-ST elevation (NSTEMI) myocardial infarction: Secondary | ICD-10-CM

## 2017-11-26 DIAGNOSIS — Z955 Presence of coronary angioplasty implant and graft: Secondary | ICD-10-CM

## 2017-11-26 LAB — GLUCOSE, CAPILLARY
GLUCOSE-CAPILLARY: 205 mg/dL — AB (ref 70–99)
GLUCOSE-CAPILLARY: 206 mg/dL — AB (ref 70–99)

## 2017-11-26 NOTE — Progress Notes (Signed)
Daily Session Note  Patient Details  Name: Todd Barr MRN: [id number] Date of Birth: 08-08-1945 Referring Provider:     Cardiac Rehab from 11/23/2017 in [facility] Cardiac and Pulmonary Rehab  Referring Provider  Barbie Haggis MD ([place])      Encounter Date: 11/26/2017  Check In: Session Check In - 11/26/17 0810      Check-In   Supervising physician immediately available to respond to emergencies  See telemetry face sheet for immediately available ER MD    Location  [facility]-Cardiac & Pulmonary Rehab    Staff Present  Joellyn Rued, BS, PEC;Carroll Enterkin, RN, BSN;Akesha Uresti [place], MA, RCEP, CCRP, Exercise Physiologist;Amanda Oletta Darter, BA, ACSM CEP, Exercise Physiologist    Medication changes reported      No    Fall or balance concerns reported     No    Warm-up and Cool-down  Performed on first and last piece of equipment    Resistance Training Performed  Yes    VAD Patient?  No    PAD/SET Patient?  No      Pain Assessment   Currently in Pain?  No/denies          Social History   Tobacco Use  Smoking Status Never Smoker  Smokeless Tobacco Never Used    Goals Met:  Exercise tolerated well Personal goals reviewed No report of cardiac concerns or symptoms Strength training completed today  Goals Unmet:  Not Applicable  Comments: First full day of exercise!  Patient was oriented to gym and equipment including functions, settings, policies, and procedures.  Patient's individual exercise prescription and treatment plan were reviewed.  All starting workloads were established based on the results of the 6 minute walk test done at initial orientation visit.  The plan for exercise progression was also introduced and progression will be customized based on patient's performance and goals.    Dr. Emily Filbert is Medical Director for [place] and [facility].

## 2017-12-01 ENCOUNTER — Encounter: Payer: No Typology Code available for payment source | Admitting: *Deleted

## 2017-12-01 DIAGNOSIS — I214 Non-ST elevation (NSTEMI) myocardial infarction: Secondary | ICD-10-CM

## 2017-12-01 DIAGNOSIS — Z955 Presence of coronary angioplasty implant and graft: Secondary | ICD-10-CM

## 2017-12-01 LAB — GLUCOSE, CAPILLARY
GLUCOSE-CAPILLARY: 295 mg/dL — AB (ref 70–99)
Glucose-Capillary: 281 mg/dL — ABNORMAL HIGH (ref 70–99)

## 2017-12-01 NOTE — Progress Notes (Signed)
Daily Session Note  Patient Details  Name: Todd Barr MRN: [id number] Date of Birth: [birth year] Referring Provider:     Cardiac Rehab from 11/23/2017 in [facility] Cardiac and Pulmonary Rehab  Referring Provider  Barbie Haggis MD ([place])      Encounter Date: 12/01/2017  Check In: Session Check In - 12/01/17 2297      Check-In   Supervising physician immediately available to respond to emergencies  See telemetry face sheet for immediately available ER MD    Location  [facility]-Cardiac & Pulmonary Rehab    Staff Present  Joellyn Rued, BS, PEC;Krista [place], RN BSN;Jessica [place]ri[place], MA, RCEP, CCRP, Exercise Physiologist;Alece Koppel Oletta Darter, [place], ACSM CEP, Exercise Physiologist    Medication changes reported      No    Fall or balance concerns reported     No    Warm-up and Cool-down  Performed on first and last piece of equipment    Resistance Training Performed  Yes    VAD Patient?  No    PAD/SET Patient?  No      Pain Assessment   Currently in Pain?  No/denies          Social History   Tobacco Use  Smoking Status Never Smoker  Smokeless Tobacco Never Used    Goals Met:  Independence with exercise equipment Exercise tolerated well No report of cardiac concerns or symptoms Strength training completed today  Goals Unmet:  Not Applicable  Comments: Pt able to follow exercise prescription today without complaint.  Will continue to monitor for progression.    Dr. Emily Filbert is Medical Director for [place] and [facility].

## 2017-12-03 DIAGNOSIS — I214 Non-ST elevation (NSTEMI) myocardial infarction: Secondary | ICD-10-CM | POA: Diagnosis not present

## 2017-12-03 DIAGNOSIS — Z955 Presence of coronary angioplasty implant and graft: Secondary | ICD-10-CM

## 2017-12-03 NOTE — Progress Notes (Signed)
Daily Session Note  Patient Details  Name: Todd Barr MRN: [id number] Date of Birth: 03/29/1946 Referring Provider:     Cardiac Rehab from 11/23/2017 in [facility] Cardiac and Pulmonary Rehab  Referring Provider  Barbie Haggis MD ([place])      Encounter Date: 12/03/2017  Check In: Session Check In - 12/03/17 0825      Check-In   Supervising physician immediately available to respond to emergencies  See telemetry face sheet for immediately available ER MD    Location  [facility]-Cardiac & Pulmonary Rehab    Staff Present  Gerlene Burdock, RN, BSN;Mandi Arthur, BS, PEC;Jessica [place], MA, RCEP, CCRP, Exercise Physiologist;Amanda Oletta Darter, [place], ACSM CEP, Exercise Physiologist    Medication changes reported      No    Fall or balance concerns reported     No    Warm-up and Cool-down  Performed on first and last piece of equipment    Resistance Training Performed  Yes    VAD Patient?  No    PAD/SET Patient?  No      Pain Assessment   Currently in Pain?  No/denies    Multiple Pain Sites  No          Social History   Tobacco Use  Smoking Status Never Smoker  Smokeless Tobacco Never Used    Goals Met:  Independence with exercise equipment Exercise tolerated well No report of cardiac concerns or symptoms Strength training completed today  Goals Unmet:  Not Applicable  Comments: Pt able to follow exercise prescription today without complaint.  Will continue to monitor for progression.    Dr. Emily Filbert is Medical Director for [place] and [facility].

## 2017-12-10 ENCOUNTER — Encounter: Payer: No Typology Code available for payment source | Admitting: *Deleted

## 2017-12-10 DIAGNOSIS — I214 Non-ST elevation (NSTEMI) myocardial infarction: Secondary | ICD-10-CM | POA: Diagnosis not present

## 2017-12-10 DIAGNOSIS — Z955 Presence of coronary angioplasty implant and graft: Secondary | ICD-10-CM

## 2017-12-10 LAB — GLUCOSE, CAPILLARY: GLUCOSE-CAPILLARY: 234 mg/dL — AB (ref 70–99)

## 2017-12-10 NOTE — Progress Notes (Signed)
Daily Session Note  Patient Details  Name: Todd Barr MRN: [id number] Date of Birth: 05-Jul-1945 Referring Provider:     Cardiac Rehab from 11/23/2017 in [facility] Cardiac and Pulmonary Rehab  Referring Provider  Barbie Haggis MD ([place])      Encounter Date: 12/10/2017  Check In: Session Check In - 12/10/17 0805      Check-In   Supervising physician immediately available to respond to emergencies  See telemetry face sheet for immediately available ER MD    Location  [facility]-Cardiac & Pulmonary Rehab    Staff Present  Joellyn Rued, BS, PEC;Carroll Enterkin, RN, BSN;Jessica [place], MA, RCEP, CCRP, Exercise Physiologist;Jidenna Figgs Oletta Darter, BA, ACSM CEP, Exercise Physiologist    Medication changes reported      No    Fall or balance concerns reported     No    Warm-up and Cool-down  Performed on first and last piece of equipment    Resistance Training Performed  Yes    VAD Patient?  No    PAD/SET Patient?  No      Pain Assessment   Currently in Pain?  No/denies    Multiple Pain Sites  No          Social History   Tobacco Use  Smoking Status Never Smoker  Smokeless Tobacco Never Used    Goals Met:  Independence with exercise equipment Exercise tolerated well No report of cardiac concerns or symptoms Strength training completed today  Goals Unmet:  Not Applicable  Comments: Pt able to follow exercise prescription today without complaint.  Will continue to monitor for progression. Reviewed home exercise with pt today.  Pt plans to walk at home, and use his nustep for exercise.  Reviewed THR, pulse, RPE, sign and symptoms, NTG use, and when to call 911 or MD.  Also discussed weather considerations and indoor options.  Pt voiced understanding.  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 We talked about making sure he gets clearance before returning to rehab.  Dr. Emily Filbert is Medical Director for [place] and  [facility].

## 2017-12-15 ENCOUNTER — Encounter: Payer: No Typology Code available for payment source | Attending: Internal Medicine

## 2017-12-15 DIAGNOSIS — Z79899 Other long term (current) drug therapy: Secondary | ICD-10-CM | POA: Insufficient documentation

## 2017-12-15 DIAGNOSIS — Z955 Presence of coronary angioplasty implant and graft: Secondary | ICD-10-CM | POA: Insufficient documentation

## 2017-12-15 DIAGNOSIS — I214 Non-ST elevation (NSTEMI) myocardial infarction: Secondary | ICD-10-CM | POA: Insufficient documentation

## 2017-12-15 DIAGNOSIS — Z794 Long term (current) use of insulin: Secondary | ICD-10-CM | POA: Insufficient documentation

## 2017-12-22 ENCOUNTER — Telehealth: Payer: Self-pay | Admitting: *Deleted

## 2017-12-22 ENCOUNTER — Encounter: Payer: Self-pay | Admitting: *Deleted

## 2017-12-22 DIAGNOSIS — Z955 Presence of coronary angioplasty implant and graft: Secondary | ICD-10-CM

## 2017-12-22 DIAGNOSIS — I214 Non-ST elevation (NSTEMI) myocardial infarction: Secondary | ICD-10-CM

## 2017-12-22 NOTE — Telephone Encounter (Signed)
Spoke with wife.  [name] had his fistula placed last week.  He has his follow scheduled for next week.  Reminded her that we will need clearance for him to return.

## 2017-12-23 ENCOUNTER — Encounter: Payer: Self-pay | Admitting: *Deleted

## 2017-12-23 DIAGNOSIS — Z955 Presence of coronary angioplasty implant and graft: Secondary | ICD-10-CM

## 2017-12-23 DIAGNOSIS — I214 Non-ST elevation (NSTEMI) myocardial infarction: Secondary | ICD-10-CM

## 2017-12-23 NOTE — Progress Notes (Signed)
Cardiac Individual Treatment Plan  Patient Details  Name: Todd Barr MRN: [id number] Date of Birth: 24-Jan-1946 Referring Provider:     Cardiac Rehab from 11/23/2017 in [facility] Cardiac and Pulmonary Rehab  Referring Provider  Barbie Haggis MD ([place])      Initial Encounter Date:    Cardiac Rehab from 11/23/2017 in [facility] Cardiac and Pulmonary Rehab  Date  11/23/17      Visit Diagnosis: NSTEMI (non-ST elevated myocardial infarction) [facility])  Status post coronary artery stent placement  Patient's Home Medications on Admission:  Current Outpatient Medications:  .  amLODipine (NORVASC) 10 MG tablet, Take 10 mg by mouth daily., Disp: , Rfl:  .  aspirin 81 MG chewable tablet, Chew by mouth., Disp: , Rfl:  .  atorvastatin (LIPITOR) 80 MG tablet, Take 80 mg by mouth at bedtime., Disp: , Rfl:  .  calcitRIOL (ROCALTROL) 0.25 MCG capsule, Take 0.25 mcg by mouth daily., Disp: , Rfl:  .  calcium acetate (PHOSLO) 667 MG capsule, Take 1,334 mg by mouth 2 (two) times daily with a meal., Disp: , Rfl:  .  Cholecalciferol 1000 units tablet, Take 1,000 Units by mouth daily., Disp: , Rfl:  .  clopidogrel (PLAVIX) 75 MG tablet, Take 75 mg by mouth daily., Disp: , Rfl:  .  ferrous sulfate 325 (65 FE) MG EC tablet, Take 325 mg by mouth 3 (three) times daily with meals., Disp: , Rfl:  .  glucose blood test strip, 1 each by Other route as needed for other. Use as instructed, Disp: , Rfl:  .  hydrALAZINE (APRESOLINE) 50 MG tablet, Take 50 mg by mouth 3 (three) times daily., Disp: , Rfl:  .  insulin glargine (LANTUS) 100 UNIT/ML injection, Inject 25 Units into the skin 2 (two) times daily., Disp: , Rfl:  .  insulin regular (NOVOLIN R,HUMULIN R) 100 units/mL injection, Inject 15 Units into the skin 2 (two) times daily before a meal., Disp: , Rfl:  .  isosorbide mononitrate (IMDUR) 60 MG 24 hr tablet, Take 60 mg by mouth every morning., Disp: , Rfl:  .  magnesium oxide (MAG-OX) 400 MG tablet, Take 400 mg by mouth daily.,  Disp: , Rfl:  .  metoprolol (TOPROL-XL) 200 MG 24 hr tablet, Take 100 mg by mouth daily., Disp: , Rfl:  .  nitroGLYCERIN (NITROSTAT) 0.4 MG SL tablet, Place 0.4 mg under the tongue every 5 (five) minutes as needed for chest pain., Disp: , Rfl:  .  omeprazole (PRILOSEC) 20 MG capsule, Take 20 mg by mouth every morning., Disp: , Rfl:  .  ranitidine (ZANTAC) 150 MG tablet, Take 150 mg by mouth 2 (two) times daily as needed for heartburn., Disp: , Rfl:  .  simethicone (MYLICON) 80 MG chewable tablet, Chew 80 mg by mouth 3 (three) times daily as needed for flatulence., Disp: , Rfl:  .  sodium bicarbonate 650 MG tablet, Take 650 mg by mouth 2 (two) times daily., Disp: , Rfl:   Past Medical History: No past medical history on file.  Tobacco Use: Social History   Tobacco Use  Smoking Status Never Smoker  Smokeless Tobacco Never Used    Labs: Recent Review Flowsheet Data    There is no flowsheet data to display.       Exercise Target Goals: Exercise Program Goal: Individual exercise prescription set using results from initial 6 min walk test and THRR while considering  patient's activity barriers and safety.   Exercise Prescription Goal: Initial exercise prescription builds to  30-45 minutes a day of aerobic activity, 2-3 days per week.  Home exercise guidelines will be given to patient during program as part of exercise prescription that the participant will acknowledge.  Activity Barriers & Risk Stratification: Activity Barriers & Cardiac Risk Stratification - 11/23/17 1251      Activity Barriers & Cardiac Risk Stratification   Activity Barriers  Arthritis;Neck/Spine Problems;Deconditioning;Muscular Weakness    Cardiac Risk Stratification  High       6 Minute Walk: 6 Minute Walk    Row Name 11/23/17 1427         6 Minute Walk   Phase  Initial     Distance  1115 feet     Walk Time  6 minutes     # of Rest Breaks  0     MPH  2.11     METS  2.89     RPE  12     VO2 Peak   10.09     Symptoms  No     Resting HR  75 bpm     Resting BP  146/60     Resting Oxygen Saturation   99 %     Exercise Oxygen Saturation  during 6 min walk  97 %     Max Ex. HR  119 bpm     Max Ex. BP  158/84     2 Minute Post BP  134/64        Oxygen Initial Assessment:   Oxygen Re-Evaluation:   Oxygen Discharge (Final Oxygen Re-Evaluation):   Initial Exercise Prescription: Initial Exercise Prescription - 11/23/17 1400      Date of Initial Exercise RX and Referring Provider   Date  11/23/17    Referring Provider  Barbie Haggis MD ([place])      Treadmill   MPH  2    Grade  0.5    Minutes  15    METs  2.67      NuStep   Level  2    SPM  80    Minutes  15    METs  2      Biostep-RELP   Level  2    SPM  50    Minutes  15    METs  2      Prescription Details   Frequency (times per week)  2    Duration  Progress to 30 minutes of continuous aerobic without signs/symptoms of physical distress      Intensity   THRR 40-80% of Max Heartrate  105-135    Ratings of Perceived Exertion  11-13    Perceived Dyspnea  0-4      Progression   Progression  Continue to progress workloads to maintain intensity without signs/symptoms of physical distress.      Resistance Training   Training Prescription  Yes    Weight  3 lbs    Reps  10-15       Perform Capillary Blood Glucose checks as needed.  Exercise Prescription Changes: Exercise Prescription Changes    Row Name 11/23/17 1300 12/01/17 1400 12/10/17 0800 12/15/17 1400       Response to Exercise   Blood Pressure (Admit)  146/60  140/66  -  155/60    Blood Pressure (Exercise)  158/84  154/66  -  166/68    Blood Pressure (Exit)  134/64  144/50  -  154/58    Heart Rate (Admit)  75 bpm  99 bpm  -  81 bpm    Heart Rate (Exercise)  119 bpm  121 bpm  -  115 bpm    Heart Rate (Exit)  81 bpm  94 bpm  -  86 bpm    Oxygen Saturation (Admit)  99 %  -  -  -    Oxygen Saturation (Exercise)  97 %  -  -  -    Rating of  Perceived Exertion (Exercise)  12  13  -  13    Symptoms  none  none  -  none    Comments  walk test results  second full day of exercise  -  -    Duration  -  Continue with 30 min of aerobic exercise without signs/symptoms of physical distress.  -  Continue with 30 min of aerobic exercise without signs/symptoms of physical distress.    Intensity  -  THRR unchanged  -  THRR unchanged      Progression   Progression  -  Continue to progress workloads to maintain intensity without signs/symptoms of physical distress.  -  Continue to progress workloads to maintain intensity without signs/symptoms of physical distress.    Average METs  -  2.18  -  2.38      Resistance Training   Training Prescription  -  Yes  -  Yes    Weight  -  3 lbs  -  4 lbs    Reps  -  10-15  -  10-15      Interval Training   Interval Training  -  No  -  No      Treadmill   MPH  -  1.5  -  1.5    Grade  -  0.5  -  0.5    Minutes  -  15  -  15    METs  -  2.25  -  2.25      NuStep   Level  -  2  -  3    Minutes  -  15  -  15    METs  -  2.3  -  2.9      Biostep-RELP   Level  -  2  -  2    Minutes  -  15  -  15    METs  -  2  -  2      Home Exercise Plan   Plans to continue exercise at  -  -  Home (comment) walking and nustep   Home (comment) walking and nustep     Frequency  -  -  Add 3 additional days to program exercise sessions.  Add 3 additional days to program exercise sessions.    Initial Home Exercises Provided  -  -  12/10/17  12/10/17       Exercise Comments: Exercise Comments    Row Name 11/26/17 [phone] 12/10/17 0827         Exercise Comments  First full day of exercise!  Patient was oriented to gym and equipment including functions, settings, policies, and procedures.  Patient's individual exercise prescription and treatment plan were reviewed.  All starting workloads were established based on the results of the 6 minute walk test done at initial orientation visit.  The plan for exercise  progression was also introduced and progression will be customized based on patient's performance and goals.  Reviewed home exercise with pt today.  Pt plans to walk at  home, and use his nustep for exercise.  Reviewed THR, pulse, RPE, sign and symptoms, NTG use, and when to call 911 or MD.  Also discussed weather considerations and indoor options.  Pt voiced understanding.         Exercise Goals and Review: Exercise Goals    Row Name 11/23/17 1430             Exercise Goals   Increase Physical Activity  Yes       Intervention  Provide advice, education, support and counseling about physical activity/exercise needs.;Develop an individualized exercise prescription for aerobic and resistive training based on initial evaluation findings, risk stratification, comorbidities and participant's personal goals.       Expected Outcomes  Short Term: Attend rehab on a regular basis to increase amount of physical activity.;Long Term: Add in home exercise to make exercise part of routine and to increase amount of physical activity.;Long Term: Exercising regularly at least 3-5 days a week.       Increase Strength and Stamina  Yes       Intervention  Provide advice, education, support and counseling about physical activity/exercise needs.;Develop an individualized exercise prescription for aerobic and resistive training based on initial evaluation findings, risk stratification, comorbidities and participant's personal goals.       Expected Outcomes  Short Term: Increase workloads from initial exercise prescription for resistance, speed, and METs.;Short Term: Perform resistance training exercises routinely during rehab and add in resistance training at home;Long Term: Improve cardiorespiratory fitness, muscular endurance and strength as measured by increased METs and functional capacity (6MWT)       Able to understand and use rate of perceived exertion (RPE) scale  Yes       Intervention  Provide education and  explanation on how to use RPE scale       Expected Outcomes  Short Term: Able to use RPE daily in rehab to express subjective intensity level;Long Term:  Able to use RPE to guide intensity level when exercising independently       Knowledge and understanding of Target Heart Rate Range (THRR)  Yes       Intervention  Provide education and explanation of THRR including how the numbers were predicted and where they are located for reference       Expected Outcomes  Short Term: Able to use daily as guideline for intensity in rehab;Long Term: Able to use THRR to govern intensity when exercising independently;Short Term: Able to state/look up THRR       Able to check pulse independently  Yes       Intervention  Review the importance of being able to check your own pulse for safety during independent exercise;Provide education and demonstration on how to check pulse in carotid and radial arteries.       Expected Outcomes  Short Term: Able to explain why pulse checking is important during independent exercise;Long Term: Able to check pulse independently and accurately       Understanding of Exercise Prescription  Yes       Intervention  Provide education, explanation, and written materials on patient's individual exercise prescription       Expected Outcomes  Short Term: Able to explain program exercise prescription;Long Term: Able to explain home exercise prescription to exercise independently          Exercise Goals Re-Evaluation : Exercise Goals Re-Evaluation    Row Name 11/26/17 [phone] 12/01/17 1456 12/03/17 0830 12/10/17 0828 12/15/17 1418  Exercise Goal Re-Evaluation   Exercise Goals Review  Increase Physical Activity;Increase Strength and Stamina;Able to understand and use rate of perceived exertion (RPE) scale;Knowledge and understanding of Target Heart Rate Range (THRR);Understanding of Exercise Prescription  Increase Physical Activity;Increase Strength and Stamina;Understanding of Exercise  Prescription  Increase Physical Activity;Increase Strength and Stamina;Understanding of Exercise Prescription  Increase Physical Activity;Increase Strength and Stamina;Understanding of Exercise Prescription  Increase Physical Activity;Increase Strength and Stamina;Understanding of Exercise Prescription   Comments  Reviewed RPE scale, THR and program prescription with pt today.  Pt voiced understanding and was given a copy of goals to take home.   Todd Barr is off to a good start in rehab.  He has completed his first two days in rehab.  We will continue to monitor his progression.   Todd Barr states that he feels like he is doing okay so far with exercise. He reports feeling stronger already and is pleased with his progress. He will be having surgery on his left arm September 3, which he doesn't want to let set him back.   Reviewed home exercise with pt today.  Pt plans to walk at home, and use his nustep for exercise.  Reviewed THR, pulse, RPE, sign and symptoms, NTG use, and when to call 911 or MD.  Also discussed weather considerations and indoor options.  Pt voiced understanding.  Todd Barr has been doing well in rehab.  He has moved up on the NuStep to level 3.  We will continue to monitor his progress.    Expected Outcomes  Short: Use RPE daily to regulate intensity. Long: Follow program prescription in THR.  Short: Continue to attend rehab regularly.  Long: Continue to follow program prescription.   Short: continue to attend rehab regularly. Long: become independent on the exercise equipment.   Short: add an additonal 3 days of exercise at home Long: increase overall activity level   Short: Increase workload on treadmill.  Long: Continue to improve strength and stamina.       Discharge Exercise Prescription (Final Exercise Prescription Changes): Exercise Prescription Changes - 12/15/17 1400      Response to Exercise   Blood Pressure (Admit)  155/60    Blood Pressure (Exercise)  166/68    Blood Pressure (Exit)   154/58    Heart Rate (Admit)  81 bpm    Heart Rate (Exercise)  115 bpm    Heart Rate (Exit)  86 bpm    Rating of Perceived Exertion (Exercise)  13    Symptoms  none    Duration  Continue with 30 min of aerobic exercise without signs/symptoms of physical distress.    Intensity  THRR unchanged      Progression   Progression  Continue to progress workloads to maintain intensity without signs/symptoms of physical distress.    Average METs  2.38      Resistance Training   Training Prescription  Yes    Weight  4 lbs    Reps  10-15      Interval Training   Interval Training  No      Treadmill   MPH  1.5    Grade  0.5    Minutes  15    METs  2.25      NuStep   Level  3    Minutes  15    METs  2.9      Biostep-RELP   Level  2    Minutes  15    METs  2  Home Exercise Plan   Plans to continue exercise at  Home (comment)   walking and nustep    Frequency  Add 3 additional days to program exercise sessions.    Initial Home Exercises Provided  12/10/17       Nutrition:  Target Goals: Understanding of nutrition guidelines, daily intake of sodium <1532m, cholesterol <2038m calories 30% from fat and 7% or less from saturated fats, daily to have 5 or more servings of fruits and vegetables.  Biometrics: Pre Biometrics - 11/23/17 1431      Pre Biometrics   Height  5' 9.4" (1.763 m)    Weight  205 lb 4.8 oz (93.1 kg)    Waist Circumference  42.5 inches    Hip Circumference  41.5 inches    Waist to Hip Ratio  1.02 %    BMI (Calculated)  29.96    Single Leg Stand  14.05 seconds        Nutrition Therapy Plan and Nutrition Goals: Nutrition Therapy & Goals - 11/26/17 0843      Nutrition Therapy   Diet  DM    Drug/Food Interactions  Statins/Certain Fruits    Protein (specify units)  12oz    Fiber  30 grams    Whole Grain Foods  3 servings   eats some whole grains, likes sourdough bread   Saturated Fats  15 max. grams    Fruits and Vegetables  5 servings/day   8  ideal, eats 2 meals/day typically   Sodium  1500 grams      Personal Nutrition Goals   Nutrition Goal  Become more familiar with foods high in potassium and phosphorus. Limit or avoid these foods d/t dialysis treatments   handouts provided which listed foods high in potassium and phosphorus   Personal Goal #2  Eat on a consistent schedule each day, trying not to skip meals or go long periods of time without eating in order to best manage BG levels. Keeping your new schedule of adding breakfast daily would be ideal    Personal Goal #3  Continue to limit sodium intake, paying particular attention to canned items and pre-prepared meals. Upper limit range for sodium on a frozen/ pre-prepared meal is 500-60016mer meal    Comments  He monitors sodium and total fluid intake d/t dialysis treatments. He also avoids some foods with potassium and phosphorus, in addition to being on medications which limit absorption. He does not follow a diabetic diet but does try to limit his sugar intake and keep carbs "lower"      Intervention Plan   Intervention  Nutrition handout(s) given to patient.;Prescribe, educate and counsel regarding individualized specific dietary modifications aiming towards targeted core components such as weight, hypertension, lipid management, diabetes, heart failure and other comorbidities.   Potssium & sodium content of foods, Controlling your phosphorus    Expected Outcomes  Short Term Goal: Understand basic principles of dietary content, such as calories, fat, sodium, cholesterol and nutrients.;Short Term Goal: A plan has been developed with personal nutrition goals set during dietitian appointment.;Long Term Goal: Adherence to prescribed nutrition plan.       Nutrition Assessments: Nutrition Assessments - 11/23/17 1254      MEDFICTS Scores   Pre Score  52       Nutrition Goals Re-Evaluation: Nutrition Goals Re-Evaluation    Row Name 11/26/17 0909 12/03/17 0836            Goals   Current Weight  -  207 lb (93.9 kg)      Nutrition Goal  Become more familiar with foods high in potassium and phosphorus. Limit or avoid these foods d/t dialysis treatments  Still working on becoming familiar with food that contain potassium.       Comment  He is on dialysis and monitors potassium and phosphorus in foods to a certain extent but could use a more in-depth review of foods and beverages that contain these two micronutrients  Todd Barr says he's been watchig out for foods that are high in potassium for some time now -- says his bllod levels were great the last time he had dialysis.       Expected Outcome  He will avoid most foods high in potassium and phosphorus, and limit foods moderate in these micronutrients  Short: become more aware of foods high in potassium Long: avoid foods high in potassium         Personal Goal #2 Re-Evaluation   Personal Goal #2  Eat on a consistent schedule each day, trying not to skip meals or go long periods of time without eating to better manage BG levels. Keeping your new schedule of adding breakfast daily would be ideal  -        Personal Goal #3 Re-Evaluation   Personal Goal #3  Continue to limit sodium intake- paying particular attention to canned items and pre-prepared meals. Upper limit range for sodium on a frozen/ pre-pepared meal is 500-'600mg'$  per meal  -         Nutrition Goals Discharge (Final Nutrition Goals Re-Evaluation): Nutrition Goals Re-Evaluation - 12/03/17 0836      Goals   Current Weight  207 lb (93.9 kg)    Nutrition Goal  Still working on becoming familiar with food that contain potassium.     Comment  Todd Barr says he's been watchig out for foods that are high in potassium for some time now -- says his bllod levels were great the last time he had dialysis.     Expected Outcome  Short: become more aware of foods high in potassium Long: avoid foods high in potassium        Psychosocial: Target Goals: Acknowledge presence or  absence of significant depression and/or stress, maximize coping skills, provide positive support system. Participant is able to verbalize types and ability to use techniques and skills needed for reducing stress and depression.   Initial Review & Psychosocial Screening: Initial Psych Review & Screening - 11/23/17 1252      Initial Review   Current issues with  Current Stress Concerns    Source of Stress Concerns  Chronic Illness;Unable to perform yard/household activities    Comments  Murtaza has just been placed on Dialysis (T, Th, Sa) this past month. He is supposed to go for a fistula in October. This has really been hard. He reports a lot of his issues he does not know if it is coming from his recent heart attack or his failing kindeys.       Family Dynamics   Good Support System?  Yes   spouse     Screening Interventions   Interventions  Encouraged to exercise;Program counselor consult;To provide support and resources with identified psychosocial needs;Provide feedback about the scores to participant    Expected Outcomes  Short Term goal: Utilizing psychosocial counselor, staff and physician to assist with identification of specific Stressors or current issues interfering with healing process. Setting desired goal for each stressor or current issue identified.;Long  Term Goal: Stressors or current issues are controlled or eliminated.;Short Term goal: Identification and review with participant of any Quality of Life or Depression concerns found by scoring the questionnaire.;Long Term goal: The participant improves quality of Life and PHQ9 Scores as seen by post scores and/or verbalization of changes       Quality of Life Scores:  Quality of Life - 11/23/17 1253      Quality of Life   Select  Quality of Life      Quality of Life Scores   Health/Function Pre  19.33 %    Socioeconomic Pre  20.63 %    Psych/Spiritual Pre  24.29 %    Family Pre  30 %    GLOBAL Pre  22.14 %       Scores of 19 and below usually indicate a poorer quality of life in these areas.  A difference of  2-3 points is a clinically meaningful difference.  A difference of 2-3 points in the total score of the Quality of Life Index has been associated with significant improvement in overall quality of life, self-image, physical symptoms, and general health in studies assessing change in quality of life.  PHQ-9: Recent Review Flowsheet Data    Depression screen [facility] 2/9 11/23/2017   Decreased Interest 0   Down, Depressed, Hopeless 0   PHQ - 2 Score 0   Altered sleeping 0   Tired, decreased energy 2   Change in appetite 0   Feeling bad or failure about yourself  0   Trouble concentrating 0   Moving slowly or fidgety/restless 0   Suicidal thoughts 0   PHQ-9 Score 2   Difficult doing work/chores Somewhat difficult     Interpretation of Total Score  Total Score Depression Severity:  1-4 = Minimal depression, 5-9 = Mild depression, 10-14 = Moderate depression, 15-19 = Moderately severe depression, 20-27 = Severe depression   Psychosocial Evaluation and Intervention: Psychosocial Evaluation - 12/10/17 1002      Psychosocial Evaluation & Interventions   Interventions  Encouraged to exercise with the program and follow exercise prescription    Comments  Counselor met with Mr. Freimark today for initial psychosocial evaluation.  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  Todd Barr has a strong support system with a spouse of 19 years; a son in [place] and active involvement in his local church community.  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  Todd Barr sleeps well with a CPAP and has a good appetite.  He denies a history of depression or anxiety or any current symptoms and states he is typically in a positive mood.  Todd Barr's health is reportedly his primary stressor at this time.  He has goals to increase  his stamina while in this program.  Staff will follow with him.    Expected Outcomes  Short:  Todd Barr will exercise consistently to increase his stamina and for his mental health as a positive coping strategy for stress.   Long:  Todd Barr will develop a positive and healthy lifestyle routine of exercise and diet and stress management..    Continue Psychosocial Services   Follow up required by staff       Psychosocial Re-Evaluation: Psychosocial Re-Evaluation    [place] Name 12/03/17 [phone]             Psychosocial Re-Evaluation  Current issues with  None Identified       Comments  Todd Barr is in good spirits. He has no trouble sleeping, he uses a CPAP and has an inclined bed that helps.        Expected Outcomes  Short: continue to be in good spirits no matter what comes his way Long: stay stress free            Psychosocial Discharge (Final Psychosocial Re-Evaluation): Psychosocial Re-Evaluation - 12/03/17 6834      Psychosocial Re-Evaluation   Current issues with  None Identified    Comments  Todd Barr is in good spirits. He has no trouble sleeping, he uses a CPAP and has an inclined bed that helps.     Expected Outcomes  Short: continue to be in good spirits no matter what comes his way Long: stay stress free         Vocational Rehabilitation: Provide vocational rehab assistance to qualifying candidates.   Vocational Rehab Evaluation & Intervention: Vocational Rehab - 11/23/17 1255      Initial Vocational Rehab Evaluation & Intervention   Assessment shows need for Vocational Rehabilitation  No       Education: Education Goals: Education classes will be provided on a variety of topics geared toward better understanding of heart health and risk factor modification. Participant will state understanding/return demonstration of topics presented as noted by education test scores.  Learning Barriers/Preferences: Learning Barriers/Preferences - 11/23/17 1254      Learning Barriers/Preferences    Learning Barriers  None    Learning Preferences  Individual Instruction;Written Material       Education Topics:  AED/CPR: - Group verbal and written instruction with the use of models to demonstrate the basic use of the AED with the basic ABC's of resuscitation.   General Nutrition Guidelines/Fats and Fiber: -Group instruction provided by verbal, written material, models and posters to present the general guidelines for heart healthy nutrition. Gives an explanation and review of dietary fats and fiber.   Controlling Sodium/Reading Food Labels: -Group verbal and written material supporting the discussion of sodium use in heart healthy nutrition. Review and explanation with models, verbal and written materials for utilization of the food label.   Exercise Physiology & General Exercise Guidelines: - Group verbal and written instruction with models to review the exercise physiology of the cardiovascular system and associated critical values. Provides general exercise guidelines with specific guidelines to those with heart or lung disease.    Aerobic Exercise & Resistance Training: - Gives group verbal and written instruction on the various components of exercise. Focuses on aerobic and resistive training programs and the benefits of this training and how to safely progress through these programs..   Flexibility, Balance, Mind/Body Relaxation: Provides group verbal/written instruction on the benefits of flexibility and balance training, including mind/body exercise modes such as yoga, pilates and tai chi.  Demonstration and skill practice provided.   Stress and Anxiety: - Provides group verbal and written instruction about the health risks of elevated stress and causes of high stress.  Discuss the correlation between heart/lung disease and anxiety and treatment options. Review healthy ways to manage with stress and anxiety.   Cardiac Rehab from 12/10/2017 in [facility] Cardiac and Pulmonary  Rehab  Date  12/01/17  Educator  [place] [facility]  Instruction Review Code  1- Verbalizes Understanding      Depression: - Provides group verbal and written instruction on the correlation between heart/lung disease and depressed mood, treatment options, and the  stigmas associated with seeking treatment.   Anatomy & Physiology of the Heart: - Group verbal and written instruction and models provide basic cardiac anatomy and physiology, with the coronary electrical and arterial systems. Review of Valvular disease and Heart Failure   Cardiac Rehab from 12/10/2017 in [facility] Cardiac and Pulmonary Rehab  Date  12/03/17  Educator  CE  Instruction Review Code  1- Verbalizes Understanding      Cardiac Procedures: - Group verbal and written instruction to review commonly prescribed medications for heart disease. Reviews the medication, class of the drug, and side effects. Includes the steps to properly store meds and maintain the prescription regimen. (beta blockers and nitrates)   Cardiac Medications I: - Group verbal and written instruction to review commonly prescribed medications for heart disease. Reviews the medication, class of the drug, and side effects. Includes the steps to properly store meds and maintain the prescription regimen.   Cardiac Medications II: -Group verbal and written instruction to review commonly prescribed medications for heart disease. Reviews the medication, class of the drug, and side effects. (all other drug classes)   Cardiac Rehab from 12/10/2017 in [facility] Cardiac and Pulmonary Rehab  Date  11/26/17  Educator  CE  Instruction Review Code  1- Verbalizes Understanding       Go Sex-Intimacy & Heart Disease, Get SMART - Goal Setting: - Group verbal and written instruction through game format to discuss heart disease and the return to sexual intimacy. Provides group verbal and written material to discuss and apply goal setting through the application of the S.M.A.R.T.  Method.   Other Matters of the Heart: - Provides group verbal, written materials and models to describe Stable Angina and Peripheral Artery. Includes description of the disease process and treatment options available to the cardiac patient.   Cardiac Rehab from 12/10/2017 in [facility] Cardiac and Pulmonary Rehab  Date  12/03/17  Educator  CE  Instruction Review Code  1- Verbalizes Understanding      Exercise & Equipment Safety: - Individual verbal instruction and demonstration of equipment use and safety with use of the equipment.   Cardiac Rehab from 12/10/2017 in [facility] Cardiac and Pulmonary Rehab  Date  11/23/17  Educator  [facility]  Instruction Review Code  1- Verbalizes Understanding      Infection Prevention: - Provides verbal and written material to individual with discussion of infection control including proper hand washing and proper equipment cleaning during exercise session.   Cardiac Rehab from 12/10/2017 in [facility] Cardiac and Pulmonary Rehab  Date  11/23/17  Educator  [facility]  Instruction Review Code  1- Verbalizes Understanding      Falls Prevention: - Provides verbal and written material to individual with discussion of falls prevention and safety.   Cardiac Rehab from 12/10/2017 in [facility] Cardiac and Pulmonary Rehab  Date  11/23/17  Educator  [facility]  Instruction Review Code  1- Verbalizes Understanding      Diabetes: - Individual verbal and written instruction to review signs/symptoms of diabetes, desired ranges of glucose level fasting, after meals and with exercise. Acknowledge that pre and post exercise glucose checks will be done for 3 sessions at entry of program.   Cardiac Rehab from 12/10/2017 in [facility] Cardiac and Pulmonary Rehab  Date  11/23/17  Educator  [facility]  Instruction Review Code  1- Verbalizes Understanding      Know Your Numbers and Risk Factors: -Group verbal and written instruction about important numbers in your health.  Discussion of what are risk  factors and how  they play a role in the disease process.  Review of Cholesterol, Blood Pressure, Diabetes, and BMI and the role they play in your overall health.   Cardiac Rehab from 12/10/2017 in [facility] Cardiac and Pulmonary Rehab  Date  11/26/17  Educator  CE  Instruction Review Code  1- Verbalizes Understanding      Sleep Hygiene: -Provides group verbal and written instruction about how sleep can affect your health.  Define sleep hygiene, discuss sleep cycles and impact of sleep habits. Review good sleep hygiene tips.    Other: -Provides group and verbal instruction on various topics (see comments)   Knowledge Questionnaire Score: Knowledge Questionnaire Score - 11/23/17 1254      Knowledge Questionnaire Score   Pre Score  25/26   correct answers reviewed with patient, focus on exercise      Core Components/Risk Factors/Patient Goals at Admission: Personal Goals and Risk Factors at Admission - 11/23/17 1255      Core Components/Risk Factors/Patient Goals on Admission    Weight Management  Yes;Weight Loss    Intervention  Weight Management: Develop a combined nutrition and exercise program designed to reach desired caloric intake, while maintaining appropriate intake of nutrient and fiber, sodium and fats, and appropriate energy expenditure required for the weight goal.;Weight Management: Provide education and appropriate resources to help participant work on and attain dietary goals.;Weight Management/Obesity: Establish reasonable short term and long term weight goals.    Admit Weight  205 lb (93 kg)    Goal Weight: Short Term  200 lb (90.7 kg)    Goal Weight: Long Term  200 lb (90.7 kg)    Expected Outcomes  Short Term: Continue to assess and modify interventions until short term weight is achieved;Long Term: Adherence to nutrition and physical activity/exercise program aimed toward attainment of established weight goal;Weight Loss: Understanding of general recommendations for a balanced deficit  meal plan, which promotes 1-2 lb weight loss per week and includes a negative energy balance of [phone] kcal/d;Understanding recommendations for meals to include 15-35% energy as protein, 25-35% energy from fat, 35-60% energy from carbohydrates, less than '200mg'$  of dietary cholesterol, 20-35 gm of total fiber daily;Understanding of distribution of calorie intake throughout the day with the consumption of 4-5 meals/snacks    Diabetes  Yes    Intervention  Provide education about signs/symptoms and action to take for hypo/hyperglycemia.;Provide education about proper nutrition, including hydration, and aerobic/resistive exercise prescription along with prescribed medications to achieve blood glucose in normal ranges: Fasting glucose 65-99 mg/dL    Expected Outcomes  Short Term: Participant verbalizes understanding of the signs/symptoms and immediate care of hyper/hypoglycemia, proper foot care and importance of medication, aerobic/resistive exercise and nutrition plan for blood glucose control.;Long Term: Attainment of HbA1C < 7%.    Hypertension  Yes    Intervention  Monitor prescription use compliance.;Provide education on lifestyle modifcations including regular physical activity/exercise, weight management, moderate sodium restriction and increased consumption of fresh fruit, vegetables, and low fat dairy, alcohol moderation, and smoking cessation.    Expected Outcomes  Short Term: Continued assessment and intervention until BP is < 140/38m HG in hypertensive participants. < 130/851mHG in hypertensive participants with diabetes, heart failure or chronic kidney disease.;Long Term: Maintenance of blood pressure at goal levels.    Lipids  Yes    Intervention  Provide education and support for participant on nutrition & aerobic/resistive exercise along with prescribed medications to achieve LDL '70mg'$ , HDL >'40mg'$ .    Expected  Outcomes  Short Term: Participant states understanding of desired cholesterol values  and is compliant with medications prescribed. Participant is following exercise prescription and nutrition guidelines.;Long Term: Cholesterol controlled with medications as prescribed, with individualized exercise RX and with personalized nutrition plan. Value goals: LDL < 1m, HDL > 40 mg.       Core Components/Risk Factors/Patient Goals Review:  Goals and Risk Factor Review    Row Name 12/03/17 [id number]            Core Components/Risk Factors/Patient Goals Review   Personal Goals Review  Weight Management/Obesity;Diabetes;Hypertension       Review  PAbbe Amsterdamtakes his medications as prescribed and is keeping his diabetes under control. 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 He would like to lose a few pounds to feel better.        Expected Outcomes  Short: continue to take medications as directed. Long: lose 5 pounds through diet and exercise           Core Components/Risk Factors/Patient Goals at Discharge (Final Review):  Goals and Risk Factor Review - 12/03/17 0838      Core Components/Risk Factors/Patient Goals Review   Personal Goals Review  Weight Management/Obesity;Diabetes;Hypertension    Review  PAbbe Amsterdamtakes his medications as prescribed and is keeping his diabetes under control. 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 He would like to lose a few pounds to feel better.     Expected Outcomes  Short: continue to take medications as directed. Long: lose 5 pounds through diet and exercise        ITP Comments: ITP Comments    Row Name 11/23/17 1216 11/25/17 0817 12/10/17 0818 12/22/17 1531 12/23/17 0616   ITP Comments  Med Review completed. Initial ITP created. [place]  30 day review completed. ITP sent to Dr. BRamonita Lab covering for Dr. MEmily Filbert Medical Director of Cardiac Rehab. Continue with ITP unless changes are made by physician.  New to program.  Has not started to  exercise yet.   Todd Barr let [place] today that he will be having a fistula placed in his left arm next Tuesday. We talked about making sure he gets clearance before returning to rehab.  Spoke with wife.  Todd Barr had his fistula placed last week.  He has his follow scheduled for next week.  Reminded her that we will need clearance for him to return.   30 day review completed. ITP sent to Dr. MEmily Filbert Medical Director of Cardiac Rehab. Continue with ITP unless changes are made by physician  Out for medical reason      Comments:

## 2018-01-11 ENCOUNTER — Encounter: Payer: Self-pay | Admitting: *Deleted

## 2018-01-11 ENCOUNTER — Telehealth: Payer: Self-pay | Admitting: *Deleted

## 2018-01-11 DIAGNOSIS — I214 Non-ST elevation (NSTEMI) myocardial infarction: Secondary | ICD-10-CM

## 2018-01-11 DIAGNOSIS — Z955 Presence of coronary angioplasty implant and graft: Secondary | ICD-10-CM

## 2018-01-11 NOTE — Telephone Encounter (Signed)
Called to check on patient.  He has a follow up appointment from his fistula placement this Wednesday.  He will need clearance to return to rehab.  He is hoping to be able to come back either this Thursday or next Tuesday.

## 2018-01-12 ENCOUNTER — Encounter: Payer: No Typology Code available for payment source | Attending: Internal Medicine

## 2018-01-12 DIAGNOSIS — Z955 Presence of coronary angioplasty implant and graft: Secondary | ICD-10-CM | POA: Insufficient documentation

## 2018-01-12 DIAGNOSIS — I214 Non-ST elevation (NSTEMI) myocardial infarction: Secondary | ICD-10-CM | POA: Insufficient documentation

## 2018-01-12 DIAGNOSIS — Z79899 Other long term (current) drug therapy: Secondary | ICD-10-CM | POA: Insufficient documentation

## 2018-01-12 DIAGNOSIS — Z794 Long term (current) use of insulin: Secondary | ICD-10-CM | POA: Insufficient documentation

## 2018-01-20 ENCOUNTER — Encounter: Payer: Self-pay | Admitting: *Deleted

## 2018-01-20 DIAGNOSIS — Z955 Presence of coronary angioplasty implant and graft: Secondary | ICD-10-CM

## 2018-01-20 DIAGNOSIS — I214 Non-ST elevation (NSTEMI) myocardial infarction: Secondary | ICD-10-CM

## 2018-01-20 NOTE — Progress Notes (Signed)
Cardiac Individual Treatment Plan  Patient Details  Name: Todd Barr MRN: [id number] Date of Birth: 24-Jan-1946 Referring Provider:     Cardiac Rehab from 11/23/2017 in [facility] Cardiac and Pulmonary Rehab  Referring Provider  Barbie Haggis MD ([place])      Initial Encounter Date:    Cardiac Rehab from 11/23/2017 in [facility] Cardiac and Pulmonary Rehab  Date  11/23/17      Visit Diagnosis: NSTEMI (non-ST elevated myocardial infarction) [facility])  Status post coronary artery stent placement  Patient's Home Medications on Admission:  Current Outpatient Medications:  .  amLODipine (NORVASC) 10 MG tablet, Take 10 mg by mouth daily., Disp: , Rfl:  .  aspirin 81 MG chewable tablet, Chew by mouth., Disp: , Rfl:  .  atorvastatin (LIPITOR) 80 MG tablet, Take 80 mg by mouth at bedtime., Disp: , Rfl:  .  calcitRIOL (ROCALTROL) 0.25 MCG capsule, Take 0.25 mcg by mouth daily., Disp: , Rfl:  .  calcium acetate (PHOSLO) 667 MG capsule, Take 1,334 mg by mouth 2 (two) times daily with a meal., Disp: , Rfl:  .  Cholecalciferol 1000 units tablet, Take 1,000 Units by mouth daily., Disp: , Rfl:  .  clopidogrel (PLAVIX) 75 MG tablet, Take 75 mg by mouth daily., Disp: , Rfl:  .  ferrous sulfate 325 (65 FE) MG EC tablet, Take 325 mg by mouth 3 (three) times daily with meals., Disp: , Rfl:  .  glucose blood test strip, 1 each by Other route as needed for other. Use as instructed, Disp: , Rfl:  .  hydrALAZINE (APRESOLINE) 50 MG tablet, Take 50 mg by mouth 3 (three) times daily., Disp: , Rfl:  .  insulin glargine (LANTUS) 100 UNIT/ML injection, Inject 25 Units into the skin 2 (two) times daily., Disp: , Rfl:  .  insulin regular (NOVOLIN R,HUMULIN R) 100 units/mL injection, Inject 15 Units into the skin 2 (two) times daily before a meal., Disp: , Rfl:  .  isosorbide mononitrate (IMDUR) 60 MG 24 hr tablet, Take 60 mg by mouth every morning., Disp: , Rfl:  .  magnesium oxide (MAG-OX) 400 MG tablet, Take 400 mg by mouth daily.,  Disp: , Rfl:  .  metoprolol (TOPROL-XL) 200 MG 24 hr tablet, Take 100 mg by mouth daily., Disp: , Rfl:  .  nitroGLYCERIN (NITROSTAT) 0.4 MG SL tablet, Place 0.4 mg under the tongue every 5 (five) minutes as needed for chest pain., Disp: , Rfl:  .  omeprazole (PRILOSEC) 20 MG capsule, Take 20 mg by mouth every morning., Disp: , Rfl:  .  ranitidine (ZANTAC) 150 MG tablet, Take 150 mg by mouth 2 (two) times daily as needed for heartburn., Disp: , Rfl:  .  simethicone (MYLICON) 80 MG chewable tablet, Chew 80 mg by mouth 3 (three) times daily as needed for flatulence., Disp: , Rfl:  .  sodium bicarbonate 650 MG tablet, Take 650 mg by mouth 2 (two) times daily., Disp: , Rfl:   Past Medical History: No past medical history on file.  Tobacco Use: Social History   Tobacco Use  Smoking Status Never Smoker  Smokeless Tobacco Never Used    Labs: Recent Review Flowsheet Data    There is no flowsheet data to display.       Exercise Target Goals: Exercise Program Goal: Individual exercise prescription set using results from initial 6 min walk test and THRR while considering  patient's activity barriers and safety.   Exercise Prescription Goal: Initial exercise prescription builds to  30-45 minutes a day of aerobic activity, 2-3 days per week.  Home exercise guidelines will be given to patient during program as part of exercise prescription that the participant will acknowledge.  Activity Barriers & Risk Stratification: Activity Barriers & Cardiac Risk Stratification - 11/23/17 1251      Activity Barriers & Cardiac Risk Stratification   Activity Barriers  Arthritis;Neck/Spine Problems;Deconditioning;Muscular Weakness    Cardiac Risk Stratification  High       6 Minute Walk: 6 Minute Walk    Row Name 11/23/17 1427         6 Minute Walk   Phase  Initial     Distance  1115 feet     Walk Time  6 minutes     # of Rest Breaks  0     MPH  2.11     METS  2.89     RPE  12     VO2 Peak   10.09     Symptoms  No     Resting HR  75 bpm     Resting BP  146/60     Resting Oxygen Saturation   99 %     Exercise Oxygen Saturation  during 6 min walk  97 %     Max Ex. HR  119 bpm     Max Ex. BP  158/84     2 Minute Post BP  134/64        Oxygen Initial Assessment:   Oxygen Re-Evaluation:   Oxygen Discharge (Final Oxygen Re-Evaluation):   Initial Exercise Prescription: Initial Exercise Prescription - 11/23/17 1400      Date of Initial Exercise RX and Referring Provider   Date  11/23/17    Referring Provider  Barbie Haggis MD ([place])      Treadmill   MPH  2    Grade  0.5    Minutes  15    METs  2.67      NuStep   Level  2    SPM  80    Minutes  15    METs  2      Biostep-RELP   Level  2    SPM  50    Minutes  15    METs  2      Prescription Details   Frequency (times per week)  2    Duration  Progress to 30 minutes of continuous aerobic without signs/symptoms of physical distress      Intensity   THRR 40-80% of Max Heartrate  105-135    Ratings of Perceived Exertion  11-13    Perceived Dyspnea  0-4      Progression   Progression  Continue to progress workloads to maintain intensity without signs/symptoms of physical distress.      Resistance Training   Training Prescription  Yes    Weight  3 lbs    Reps  10-15       Perform Capillary Blood Glucose checks as needed.  Exercise Prescription Changes: Exercise Prescription Changes    Row Name 11/23/17 1300 12/01/17 1400 12/10/17 0800 12/15/17 1400       Response to Exercise   Blood Pressure (Admit)  146/60  140/66  -  155/60    Blood Pressure (Exercise)  158/84  154/66  -  166/68    Blood Pressure (Exit)  134/64  144/50  -  154/58    Heart Rate (Admit)  75 bpm  99 bpm  -  81 bpm    Heart Rate (Exercise)  119 bpm  121 bpm  -  115 bpm    Heart Rate (Exit)  81 bpm  94 bpm  -  86 bpm    Oxygen Saturation (Admit)  99 %  -  -  -    Oxygen Saturation (Exercise)  97 %  -  -  -    Rating of  Perceived Exertion (Exercise)  12  13  -  13    Symptoms  none  none  -  none    Comments  walk test results  second full day of exercise  -  -    Duration  -  Continue with 30 min of aerobic exercise without signs/symptoms of physical distress.  -  Continue with 30 min of aerobic exercise without signs/symptoms of physical distress.    Intensity  -  THRR unchanged  -  THRR unchanged      Progression   Progression  -  Continue to progress workloads to maintain intensity without signs/symptoms of physical distress.  -  Continue to progress workloads to maintain intensity without signs/symptoms of physical distress.    Average METs  -  2.18  -  2.38      Resistance Training   Training Prescription  -  Yes  -  Yes    Weight  -  3 lbs  -  4 lbs    Reps  -  10-15  -  10-15      Interval Training   Interval Training  -  No  -  No      Treadmill   MPH  -  1.5  -  1.5    Grade  -  0.5  -  0.5    Minutes  -  15  -  15    METs  -  2.25  -  2.25      NuStep   Level  -  2  -  3    Minutes  -  15  -  15    METs  -  2.3  -  2.9      Biostep-RELP   Level  -  2  -  2    Minutes  -  15  -  15    METs  -  2  -  2      Home Exercise Plan   Plans to continue exercise at  -  -  Home (comment) walking and nustep   Home (comment) walking and nustep     Frequency  -  -  Add 3 additional days to program exercise sessions.  Add 3 additional days to program exercise sessions.    Initial Home Exercises Provided  -  -  12/10/17  12/10/17       Exercise Comments: Exercise Comments    Row Name 11/26/17 [phone] 12/10/17 0827         Exercise Comments  First full day of exercise!  Patient was oriented to gym and equipment including functions, settings, policies, and procedures.  Patient's individual exercise prescription and treatment plan were reviewed.  All starting workloads were established based on the results of the 6 minute walk test done at initial orientation visit.  The plan for exercise  progression was also introduced and progression will be customized based on patient's performance and goals.  Reviewed home exercise with pt today.  Pt plans to walk at  home, and use his nustep for exercise.  Reviewed THR, pulse, RPE, sign and symptoms, NTG use, and when to call 911 or MD.  Also discussed weather considerations and indoor options.  Pt voiced understanding.         Exercise Goals and Review: Exercise Goals    Row Name 11/23/17 1430             Exercise Goals   Increase Physical Activity  Yes       Intervention  Provide advice, education, support and counseling about physical activity/exercise needs.;Develop an individualized exercise prescription for aerobic and resistive training based on initial evaluation findings, risk stratification, comorbidities and participant's personal goals.       Expected Outcomes  Short Term: Attend rehab on a regular basis to increase amount of physical activity.;Long Term: Add in home exercise to make exercise part of routine and to increase amount of physical activity.;Long Term: Exercising regularly at least 3-5 days a week.       Increase Strength and Stamina  Yes       Intervention  Provide advice, education, support and counseling about physical activity/exercise needs.;Develop an individualized exercise prescription for aerobic and resistive training based on initial evaluation findings, risk stratification, comorbidities and participant's personal goals.       Expected Outcomes  Short Term: Increase workloads from initial exercise prescription for resistance, speed, and METs.;Short Term: Perform resistance training exercises routinely during rehab and add in resistance training at home;Long Term: Improve cardiorespiratory fitness, muscular endurance and strength as measured by increased METs and functional capacity (6MWT)       Able to understand and use rate of perceived exertion (RPE) scale  Yes       Intervention  Provide education and  explanation on how to use RPE scale       Expected Outcomes  Short Term: Able to use RPE daily in rehab to express subjective intensity level;Long Term:  Able to use RPE to guide intensity level when exercising independently       Knowledge and understanding of Target Heart Rate Range (THRR)  Yes       Intervention  Provide education and explanation of THRR including how the numbers were predicted and where they are located for reference       Expected Outcomes  Short Term: Able to use daily as guideline for intensity in rehab;Long Term: Able to use THRR to govern intensity when exercising independently;Short Term: Able to state/look up THRR       Able to check pulse independently  Yes       Intervention  Review the importance of being able to check your own pulse for safety during independent exercise;Provide education and demonstration on how to check pulse in carotid and radial arteries.       Expected Outcomes  Short Term: Able to explain why pulse checking is important during independent exercise;Long Term: Able to check pulse independently and accurately       Understanding of Exercise Prescription  Yes       Intervention  Provide education, explanation, and written materials on patient's individual exercise prescription       Expected Outcomes  Short Term: Able to explain program exercise prescription;Long Term: Able to explain home exercise prescription to exercise independently          Exercise Goals Re-Evaluation : Exercise Goals Re-Evaluation    Row Name 11/26/17 [phone] 12/01/17 1456 12/03/17 0830 12/10/17 0828 12/15/17 1418  Exercise Goal Re-Evaluation   Exercise Goals Review  Increase Physical Activity;Increase Strength and Stamina;Able to understand and use rate of perceived exertion (RPE) scale;Knowledge and understanding of Target Heart Rate Range (THRR);Understanding of Exercise Prescription  Increase Physical Activity;Increase Strength and Stamina;Understanding of Exercise  Prescription  Increase Physical Activity;Increase Strength and Stamina;Understanding of Exercise Prescription  Increase Physical Activity;Increase Strength and Stamina;Understanding of Exercise Prescription  Increase Physical Activity;Increase Strength and Stamina;Understanding of Exercise Prescription   Comments  Reviewed RPE scale, THR and program prescription with pt today.  Pt voiced understanding and was given a copy of goals to take home.   Todd Barr is off to a good start in rehab.  He has completed his first two days in rehab.  We will continue to monitor his progression.   Todd Barr states that he feels like he is doing okay so far with exercise. He reports feeling stronger already and is pleased with his progress. He will be having surgery on his left arm September 3, which he doesn't want to let set him back.   Reviewed home exercise with pt today.  Pt plans to walk at home, and use his nustep for exercise.  Reviewed THR, pulse, RPE, sign and symptoms, NTG use, and when to call 911 or MD.  Also discussed weather considerations and indoor options.  Pt voiced understanding.  Todd Barr has been doing well in rehab.  He has moved up on the NuStep to level 3.  We will continue to monitor his progress.    Expected Outcomes  Short: Use RPE daily to regulate intensity. Long: Follow program prescription in THR.  Short: Continue to attend rehab regularly.  Long: Continue to follow program prescription.   Short: continue to attend rehab regularly. Long: become independent on the exercise equipment.   Short: add an additonal 3 days of exercise at home Long: increase overall activity level   Short: Increase workload on treadmill.  Long: Continue to improve strength and stamina.    [place] Name 12/28/17 1623 01/11/18 1520           Exercise Goal Re-Evaluation   Comments  Out since last review  Out since last review         Discharge Exercise Prescription (Final Exercise Prescription Changes): Exercise Prescription Changes -  12/15/17 1400      Response to Exercise   Blood Pressure (Admit)  155/60    Blood Pressure (Exercise)  166/68    Blood Pressure (Exit)  154/58    Heart Rate (Admit)  81 bpm    Heart Rate (Exercise)  115 bpm    Heart Rate (Exit)  86 bpm    Rating of Perceived Exertion (Exercise)  13    Symptoms  none    Duration  Continue with 30 min of aerobic exercise without signs/symptoms of physical distress.    Intensity  THRR unchanged      Progression   Progression  Continue to progress workloads to maintain intensity without signs/symptoms of physical distress.    Average METs  2.38      Resistance Training   Training Prescription  Yes    Weight  4 lbs    Reps  10-15      Interval Training   Interval Training  No      Treadmill   MPH  1.5    Grade  0.5    Minutes  15    METs  2.25      NuStep   Level  3    Minutes  15    METs  2.9      Biostep-RELP   Level  2    Minutes  15    METs  2      Home Exercise Plan   Plans to continue exercise at  Home (comment)   walking and nustep    Frequency  Add 3 additional days to program exercise sessions.    Initial Home Exercises Provided  12/10/17       Nutrition:  Target Goals: Understanding of nutrition guidelines, daily intake of sodium <1576m, cholesterol <2073m calories 30% from fat and 7% or less from saturated fats, daily to have 5 or more servings of fruits and vegetables.  Biometrics: Pre Biometrics - 11/23/17 1431      Pre Biometrics   Height  5' 9.4" (1.763 m)    Weight  205 lb 4.8 oz (93.1 kg)    Waist Circumference  42.5 inches    Hip Circumference  41.5 inches    Waist to Hip Ratio  1.02 %    BMI (Calculated)  29.96    Single Leg Stand  14.05 seconds        Nutrition Therapy Plan and Nutrition Goals: Nutrition Therapy & Goals - 11/26/17 0843      Nutrition Therapy   Diet  DM    Drug/Food Interactions  Statins/Certain Fruits    Protein (specify units)  12oz    Fiber  30 grams    Whole Grain Foods   3 servings   eats some whole grains, likes sourdough bread   Saturated Fats  15 max. grams    Fruits and Vegetables  5 servings/day   8 ideal, eats 2 meals/day typically   Sodium  1500 grams      Personal Nutrition Goals   Nutrition Goal  Become more familiar with foods high in potassium and phosphorus. Limit or avoid these foods d/t dialysis treatments   handouts provided which listed foods high in potassium and phosphorus   Personal Goal #2  Eat on a consistent schedule each day, trying not to skip meals or go long periods of time without eating in order to best manage BG levels. Keeping your new schedule of adding breakfast daily would be ideal    Personal Goal #3  Continue to limit sodium intake, paying particular attention to canned items and pre-prepared meals. Upper limit range for sodium on a frozen/ pre-prepared meal is 500-60072mer meal    Comments  He monitors sodium and total fluid intake d/t dialysis treatments. He also avoids some foods with potassium and phosphorus, in addition to being on medications which limit absorption. He does not follow a diabetic diet but does try to limit his sugar intake and keep carbs "lower"      Intervention Plan   Intervention  Nutrition handout(s) given to patient.;Prescribe, educate and counsel regarding individualized specific dietary modifications aiming towards targeted core components such as weight, hypertension, lipid management, diabetes, heart failure and other comorbidities.   Potssium & sodium content of foods, Controlling your phosphorus    Expected Outcomes  Short Term Goal: Understand basic principles of dietary content, such as calories, fat, sodium, cholesterol and nutrients.;Short Term Goal: A plan has been developed with personal nutrition goals set during dietitian appointment.;Long Term Goal: Adherence to prescribed nutrition plan.       Nutrition Assessments: Nutrition Assessments - 11/23/17 1254      MEDFICTS Scores   Pre  Score  31       Nutrition Goals Re-Evaluation: Nutrition Goals Re-Evaluation    [place] Name 11/26/17 0909 12/03/17 0836           Goals   Current Weight  -  207 lb (93.9 kg)      Nutrition Goal  Become more familiar with foods high in potassium and phosphorus. Limit or avoid these foods d/t dialysis treatments  Still working on becoming familiar with food that contain potassium.       Comment  He is on dialysis and monitors potassium and phosphorus in foods to a certain extent but could use a more in-depth review of foods and beverages that contain these two micronutrients  Todd Barr says he's been watchig out for foods that are high in potassium for some time now -- says his bllod levels were great the last time he had dialysis.       Expected Outcome  He will avoid most foods high in potassium and phosphorus, and limit foods moderate in these micronutrients  Short: become more aware of foods high in potassium Long: avoid foods high in potassium         Personal Goal #2 Re-Evaluation   Personal Goal #2  Eat on a consistent schedule each day, trying not to skip meals or go long periods of time without eating to better manage BG levels. Keeping your new schedule of adding breakfast daily would be ideal  -        Personal Goal #3 Re-Evaluation   Personal Goal #3  Continue to limit sodium intake- paying particular attention to canned items and pre-prepared meals. Upper limit range for sodium on a frozen/ pre-pepared meal is 500-673m per meal  -         Nutrition Goals Discharge (Final Nutrition Goals Re-Evaluation): Nutrition Goals Re-Evaluation - 12/03/17 0836      Goals   Current Weight  207 lb (93.9 kg)    Nutrition Goal  Still working on becoming familiar with food that contain potassium.     Comment  [name] says he's been watchig out for foods that are high in potassium for some time now -- says his bllod levels were great the last time he had dialysis.     Expected Outcome  Short: become  more aware of foods high in potassium Long: avoid foods high in potassium        Psychosocial: Target Goals: Acknowledge presence or absence of significant depression and/or stress, maximize coping skills, provide positive support system. Participant is able to verbalize types and ability to use techniques and skills needed for reducing stress and depression.   Initial Review & Psychosocial Screening: Initial Psych Review & Screening - 11/23/17 1252      Initial Review   Current issues with  Current Stress Concerns    Source of Stress Concerns  Chronic Illness;Unable to perform yard/household activities    Comments  PMikohas just been placed on Dialysis (T, Th, Sa) this past month. He is supposed to go for a fistula in October. This has really been hard. He reports a lot of his issues he does not know if it is coming from his recent heart attack or his failing kindeys.       Family Dynamics   Good Support System?  Yes   spouse     Screening Interventions   Interventions  Encouraged to exercise;Program counselor consult;To provide support and resources with identified psychosocial needs;Provide feedback about the scores to  participant    Expected Outcomes  Short Term goal: Utilizing psychosocial counselor, staff and physician to assist with identification of specific Stressors or current issues interfering with healing process. Setting desired goal for each stressor or current issue identified.;Long Term Goal: Stressors or current issues are controlled or eliminated.;Short Term goal: Identification and review with participant of any Quality of Life or Depression concerns found by scoring the questionnaire.;Long Term goal: The participant improves quality of Life and PHQ9 Scores as seen by post scores and/or verbalization of changes       Quality of Life Scores:  Quality of Life - 11/23/17 1253      Quality of Life   Select  Quality of Life      Quality of Life Scores    Health/Function Pre  19.33 %    Socioeconomic Pre  20.63 %    Psych/Spiritual Pre  24.29 %    Family Pre  30 %    GLOBAL Pre  22.14 %      Scores of 19 and below usually indicate a poorer quality of life in these areas.  A difference of  2-3 points is a clinically meaningful difference.  A difference of 2-3 points in the total score of the Quality of Life Index has been associated with significant improvement in overall quality of life, self-image, physical symptoms, and general health in studies assessing change in quality of life.  PHQ-9: Recent Review Flowsheet Data    Depression screen [facility] 2/9 11/23/2017   Decreased Interest 0   Down, Depressed, Hopeless 0   PHQ - 2 Score 0   Altered sleeping 0   Tired, decreased energy 2   Change in appetite 0   Feeling bad or failure about yourself  0   Trouble concentrating 0   Moving slowly or fidgety/restless 0   Suicidal thoughts 0   PHQ-9 Score 2   Difficult doing work/chores Somewhat difficult     Interpretation of Total Score  Total Score Depression Severity:  1-4 = Minimal depression, 5-9 = Mild depression, 10-14 = Moderate depression, 15-19 = Moderately severe depression, 20-27 = Severe depression   Psychosocial Evaluation and Intervention: Psychosocial Evaluation - 12/10/17 1002      Psychosocial Evaluation & Interventions   Interventions  Encouraged to exercise with the program and follow exercise prescription    Comments  Counselor met with Mr. Maston today for initial psychosocial evaluation.  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  Todd Barr has a strong support system with a spouse of 61 years; a son in [place] and active involvement in his local church community.  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  [name] sleeps well with a CPAP and has a good appetite.  He denies a history of depression or anxiety or any current  symptoms and states he is typically in a positive mood.  [name]'s health is reportedly his primary stressor at this time.  He has goals to increase his stamina while in this program.  Staff will follow with him.    Expected Outcomes  Short:  Todd Barr will exercise consistently to increase his stamina and for his mental health as a positive coping strategy for stress.   Long:  Todd Barr will develop a positive and healthy lifestyle routine of exercise and diet and stress management..    Continue Psychosocial  Services   Follow up required by staff       Psychosocial Re-Evaluation: Psychosocial Re-Evaluation    [place] Name 12/03/17 [phone]             Psychosocial Re-Evaluation   Current issues with  None Identified       Comments  Todd Barr is in good spirits. He has no trouble sleeping, he uses a CPAP and has an inclined bed that helps.        Expected Outcomes  Short: continue to be in good spirits no matter what comes his way Long: stay stress free            Psychosocial Discharge (Final Psychosocial Re-Evaluation): Psychosocial Re-Evaluation - 12/03/17 2035      Psychosocial Re-Evaluation   Current issues with  None Identified    Comments  Todd Barr is in good spirits. He has no trouble sleeping, he uses a CPAP and has an inclined bed that helps.     Expected Outcomes  Short: continue to be in good spirits no matter what comes his way Long: stay stress free         Vocational Rehabilitation: Provide vocational rehab assistance to qualifying candidates.   Vocational Rehab Evaluation & Intervention: Vocational Rehab - 11/23/17 1255      Initial Vocational Rehab Evaluation & Intervention   Assessment shows need for Vocational Rehabilitation  No       Education: Education Goals: Education classes will be provided on a variety of topics geared toward better understanding of heart health and risk factor modification. Participant will state understanding/return demonstration of topics presented as noted  by education test scores.  Learning Barriers/Preferences: Learning Barriers/Preferences - 11/23/17 1254      Learning Barriers/Preferences   Learning Barriers  None    Learning Preferences  Individual Instruction;Written Material       Education Topics:  AED/CPR: - Group verbal and written instruction with the use of models to demonstrate the basic use of the AED with the basic ABC's of resuscitation.   General Nutrition Guidelines/Fats and Fiber: -Group instruction provided by verbal, written material, models and posters to present the general guidelines for heart healthy nutrition. Gives an explanation and review of dietary fats and fiber.   Controlling Sodium/Reading Food Labels: -Group verbal and written material supporting the discussion of sodium use in heart healthy nutrition. Review and explanation with models, verbal and written materials for utilization of the food label.   Exercise Physiology & General Exercise Guidelines: - Group verbal and written instruction with models to review the exercise physiology of the cardiovascular system and associated critical values. Provides general exercise guidelines with specific guidelines to those with heart or lung disease.    Aerobic Exercise & Resistance Training: - Gives group verbal and written instruction on the various components of exercise. Focuses on aerobic and resistive training programs and the benefits of this training and how to safely progress through these programs..   Flexibility, Balance, Mind/Body Relaxation: Provides group verbal/written instruction on the benefits of flexibility and balance training, including mind/body exercise modes such as yoga, pilates and tai chi.  Demonstration and skill practice provided.   Stress and Anxiety: - Provides group verbal and written instruction about the health risks of elevated stress and causes of high stress.  Discuss the correlation between heart/lung disease and  anxiety and treatment options. Review healthy ways to manage with stress and anxiety.   Cardiac Rehab from 12/10/2017 in [facility] Cardiac and Pulmonary Rehab  Date  12/01/17  Educator  [place]  Instruction Review Code  1- Verbalizes Understanding      Depression: - Provides group verbal and written instruction on the correlation between heart/lung disease and depressed mood, treatment options, and the stigmas associated with seeking treatment.   Anatomy & Physiology of the Heart: - Group verbal and written instruction and models provide basic cardiac anatomy and physiology, with the coronary electrical and arterial systems. Review of Valvular disease and Heart Failure   Cardiac Rehab from 12/10/2017 in [facility] Cardiac and Pulmonary Rehab  Date  12/03/17  Educator  [place]  Instruction Review Code  1- Verbalizes Understanding      Cardiac Procedures: - Group verbal and written instruction to review commonly prescribed medications for heart disease. Reviews the medication, class of the drug, and side effects. Includes the steps to properly store meds and maintain the prescription regimen. (beta blockers and nitrates)   Cardiac Medications I: - Group verbal and written instruction to review commonly prescribed medications for heart disease. Reviews the medication, class of the drug, and side effects. Includes the steps to properly store meds and maintain the prescription regimen.   Cardiac Medications II: -Group verbal and written instruction to review commonly prescribed medications for heart disease. Reviews the medication, class of the drug, and side effects. (all other drug classes)   Cardiac Rehab from 12/10/2017 in [facility] Cardiac and Pulmonary Rehab  Date  11/26/17  Educator  [place]  Instruction Review Code  1- Verbalizes Understanding       Go Sex-Intimacy & Heart Disease, Get SMART - Goal Setting: - Group verbal and written instruction through game format to discuss heart disease and the return to  sexual intimacy. Provides group verbal and written material to discuss and apply goal setting through the application of the S.M.A.R.T. Method.   Other Matters of the Heart: - Provides group verbal, written materials and models to describe Stable Angina and Peripheral Artery. Includes description of the disease process and treatment options available to the cardiac patient.   Cardiac Rehab from 12/10/2017 in [facility] Cardiac and Pulmonary Rehab  Date  12/03/17  Educator  [place]  Instruction Review Code  1- Verbalizes Understanding      Exercise & Equipment Safety: - Individual verbal instruction and demonstration of equipment use and safety with use of the equipment.   Cardiac Rehab from 12/10/2017 in [facility] Cardiac and Pulmonary Rehab  Date  11/23/17  Educator  [facility]  Instruction Review Code  1- Verbalizes Understanding      Infection Prevention: - Provides verbal and written material to individual with discussion of infection control including proper hand washing and proper equipment cleaning during exercise session.   Cardiac Rehab from 12/10/2017 in [facility] Cardiac and Pulmonary Rehab  Date  11/23/17  Educator  [facility]  Instruction Review Code  1- Verbalizes Understanding      Falls Prevention: - Provides verbal and written material to individual with discussion of falls prevention and safety.   Cardiac Rehab from 12/10/2017 in [facility] Cardiac and Pulmonary Rehab  Date  11/23/17  Educator  [facility]  Instruction Review Code  1- Verbalizes Understanding      Diabetes: - Individual verbal and written instruction to review signs/symptoms of diabetes, desired ranges of glucose level fasting, after meals and with exercise. Acknowledge that pre and post exercise glucose checks will be done for 3 sessions at entry of program.   Cardiac Rehab from 12/10/2017 in [facility] Cardiac and Pulmonary Rehab  Date  11/23/17  Educator  [place]  Instruction Review Code  1- Verbalizes Understanding      Know Your Numbers and Risk  Factors: -Group verbal and written instruction about important numbers in your health.  Discussion of what are risk factors and how they play a role in the disease process.  Review of Cholesterol, Blood Pressure, Diabetes, and BMI and the role they play in your overall health.   Cardiac Rehab from 12/10/2017 in [facility] Cardiac and Pulmonary Rehab  Date  11/26/17  Educator  [place]  Instruction Review Code  1- Verbalizes Understanding      Sleep Hygiene: -Provides group verbal and written instruction about how sleep can affect your health.  Define sleep hygiene, discuss sleep cycles and impact of sleep habits. Review good sleep hygiene tips.    Other: -Provides group and verbal instruction on various topics (see comments)   Knowledge Questionnaire Score: Knowledge Questionnaire Score - 11/23/17 1254      Knowledge Questionnaire Score   Pre Score  25/26   correct answers reviewed with patient, focus on exercise      Core Components/Risk Factors/Patient Goals at Admission: Personal Goals and Risk Factors at Admission - 11/23/17 1255      Core Components/Risk Factors/Patient Goals on Admission    Weight Management  Yes;Weight Loss    Intervention  Weight Management: Develop a combined nutrition and exercise program designed to reach desired caloric intake, while maintaining appropriate intake of nutrient and fiber, sodium and fats, and appropriate energy expenditure required for the weight goal.;Weight Management: Provide education and appropriate resources to help participant work on and attain dietary goals.;Weight Management/Obesity: Establish reasonable short term and long term weight goals.    Admit Weight  205 lb (93 kg)    Goal Weight: Short Term  200 lb (90.7 kg)    Goal Weight: Long Term  200 lb (90.7 kg)    Expected Outcomes  Short Term: Continue to assess and modify interventions until short term weight is achieved;Long Term: Adherence to nutrition and physical activity/exercise  program aimed toward attainment of established weight goal;Weight Loss: Understanding of general recommendations for a balanced deficit meal plan, which promotes 1-2 lb weight loss per week and includes a negative energy balance of [phone] kcal/d;Understanding recommendations for meals to include 15-35% energy as protein, 25-35% energy from fat, 35-60% energy from carbohydrates, less than 251m of dietary cholesterol, 20-35 gm of total fiber daily;Understanding of distribution of calorie intake throughout the day with the consumption of 4-5 meals/snacks    Diabetes  Yes    Intervention  Provide education about signs/symptoms and action to take for hypo/hyperglycemia.;Provide education about proper nutrition, including hydration, and aerobic/resistive exercise prescription along with prescribed medications to achieve blood glucose in normal ranges: Fasting glucose 65-99 mg/dL    Expected Outcomes  Short Term: Participant verbalizes understanding of the signs/symptoms and immediate care of hyper/hypoglycemia, proper foot care and importance of medication, aerobic/resistive exercise and nutrition plan for blood glucose control.;Long Term: Attainment of HbA1C < 7%.    Hypertension  Yes    Intervention  Monitor prescription use compliance.;Provide education on lifestyle modifcations including regular physical activity/exercise, weight management, moderate sodium restriction and increased consumption of fresh fruit, vegetables, and low fat dairy, alcohol moderation, and smoking cessation.    Expected Outcomes  Short Term: Continued assessment and intervention until BP is < 140/944mHG in hypertensive participants. < 130/8027mG in hypertensive participants with diabetes, heart failure or chronic kidney disease.;Long Term: Maintenance of  blood pressure at goal levels.    Lipids  Yes    Intervention  Provide education and support for participant on nutrition & aerobic/resistive exercise along with prescribed  medications to achieve LDL <22m, HDL >418m    Expected Outcomes  Short Term: Participant states understanding of desired cholesterol values and is compliant with medications prescribed. Participant is following exercise prescription and nutrition guidelines.;Long Term: Cholesterol controlled with medications as prescribed, with individualized exercise RX and with personalized nutrition plan. Value goals: LDL < 7075mHDL > 40 mg.       Core Components/Risk Factors/Patient Goals Review:  Goals and Risk Factor Review    Row Name 12/03/17 [phone]          Core Components/Risk Factors/Patient Goals Review   Personal Goals Review  Weight Management/Obesity;Diabetes;Hypertension       Review  PhiAbbe Amsterdamkes his medications as prescribed and is keeping his diabetes under control. 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 He would like to lose a few pounds to feel better.        Expected Outcomes  Short: continue to take medications as directed. Long: lose 5 pounds through diet and exercise           Core Components/Risk Factors/Patient Goals at Discharge (Final Review):  Goals and Risk Factor Review - 12/03/17 0838      Core Components/Risk Factors/Patient Goals Review   Personal Goals Review  Weight Management/Obesity;Diabetes;Hypertension    Review  PhiAbbe Amsterdamkes his medications as prescribed and is keeping his diabetes under control. 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 He would like to lose a few pounds to feel better.     Expected Outcomes  Short: continue to take medications as directed. Long: lose 5 pounds through diet and exercise        ITP Comments: ITP Comments    Row Name 11/23/17 1216 11/25/17 0817 12/10/17 0818 12/22/17 1531 12/23/17 0616   ITP Comments  Med Review completed. Initial ITP created. Diagnosis can be found in [facility] from [facility]  30 day review completed. ITP sent to Dr. BerRamonita Labovering for Dr.  MarEmily Filbertedical Director of Cardiac Rehab. Continue with ITP unless changes are made by physician.  New to program.  Has not started to exercise yet.   [name] let us [place] today that he will be having a fistula placed in his left arm next Tuesday. We talked about making sure he gets clearance before returning to rehab.  Spoke with wife.  [name] had his fistula placed last week.  He has his follow scheduled for next week.  Reminded her that we will need clearance for him to return.   30 day review completed. ITP sent to Dr. MarEmily Filbertedical Director of Cardiac Rehab. Continue with ITP unless changes are made by physician  Out for medical reason   [place] 01/11/18 1520 01/20/18 0750         ITP Comments  Called to check on patient.  He has a follow up appointment from his fistula placement this Wednesday.  He will need clearance to return to rehab.  He is hoping to be able to come back either this Thursday or next Tuesday.   30 day review completed. ITP sent to Dr. MarEmily Filbertedical Director of Cardiac Rehab. Continue with ITP unless changes are made by physician [facility] for medical reason  Comments:

## 2018-01-25 ENCOUNTER — Telehealth: Payer: Self-pay | Admitting: *Deleted

## 2018-01-25 ENCOUNTER — Encounter: Payer: Self-pay | Admitting: *Deleted

## 2018-01-25 DIAGNOSIS — Z955 Presence of coronary angioplasty implant and graft: Secondary | ICD-10-CM

## 2018-01-25 DIAGNOSIS — I214 Non-ST elevation (NSTEMI) myocardial infarction: Secondary | ICD-10-CM

## 2018-01-25 NOTE — Telephone Encounter (Signed)
Called to check on Todd Barr.  Out since 8/29 for fistula placement.  Was supposed to return last week.  Unable to leave message.  Will give until end of week, then will send letter to discharge.

## 2018-02-02 ENCOUNTER — Encounter: Payer: Self-pay | Admitting: *Deleted

## 2018-02-02 ENCOUNTER — Telehealth: Payer: Self-pay | Admitting: *Deleted

## 2018-02-02 DIAGNOSIS — Z955 Presence of coronary angioplasty implant and graft: Secondary | ICD-10-CM

## 2018-02-02 DIAGNOSIS — I214 Non-ST elevation (NSTEMI) myocardial infarction: Secondary | ICD-10-CM

## 2018-02-02 NOTE — Telephone Encounter (Signed)
Called to check on Todd Barr. [facility] to be cleared to return. Spoke with his wife. He has the appointment date in his phone and will let us know when that is.  He will returned once cleared.

## 2018-02-16 ENCOUNTER — Encounter: Payer: No Typology Code available for payment source | Attending: Internal Medicine

## 2018-02-16 DIAGNOSIS — I214 Non-ST elevation (NSTEMI) myocardial infarction: Secondary | ICD-10-CM | POA: Insufficient documentation

## 2018-02-16 DIAGNOSIS — Z79899 Other long term (current) drug therapy: Secondary | ICD-10-CM | POA: Insufficient documentation

## 2018-02-16 DIAGNOSIS — Z794 Long term (current) use of insulin: Secondary | ICD-10-CM | POA: Insufficient documentation

## 2018-02-16 DIAGNOSIS — Z955 Presence of coronary angioplasty implant and graft: Secondary | ICD-10-CM | POA: Insufficient documentation

## 2018-02-17 ENCOUNTER — Encounter: Payer: Self-pay | Admitting: *Deleted

## 2018-02-17 DIAGNOSIS — Z955 Presence of coronary angioplasty implant and graft: Secondary | ICD-10-CM

## 2018-02-17 DIAGNOSIS — I214 Non-ST elevation (NSTEMI) myocardial infarction: Secondary | ICD-10-CM

## 2018-02-17 NOTE — Progress Notes (Signed)
Cardiac Individual Treatment Plan  Patient Details  Name: Todd Barr MRN: [id number] Date of Birth: 24-Jan-1946 Referring Provider:     Cardiac Rehab from 11/23/2017 in [facility] Cardiac and Pulmonary Rehab  Referring Provider  Barbie Haggis MD ([place])      Initial Encounter Date:    Cardiac Rehab from 11/23/2017 in [facility] Cardiac and Pulmonary Rehab  Date  11/23/17      Visit Diagnosis: NSTEMI (non-ST elevated myocardial infarction) [facility])  Status post coronary artery stent placement  Patient's Home Medications on Admission:  Current Outpatient Medications:  .  amLODipine (NORVASC) 10 MG tablet, Take 10 mg by mouth daily., Disp: , Rfl:  .  aspirin 81 MG chewable tablet, Chew by mouth., Disp: , Rfl:  .  atorvastatin (LIPITOR) 80 MG tablet, Take 80 mg by mouth at bedtime., Disp: , Rfl:  .  calcitRIOL (ROCALTROL) 0.25 MCG capsule, Take 0.25 mcg by mouth daily., Disp: , Rfl:  .  calcium acetate (PHOSLO) 667 MG capsule, Take 1,334 mg by mouth 2 (two) times daily with a meal., Disp: , Rfl:  .  Cholecalciferol 1000 units tablet, Take 1,000 Units by mouth daily., Disp: , Rfl:  .  clopidogrel (PLAVIX) 75 MG tablet, Take 75 mg by mouth daily., Disp: , Rfl:  .  ferrous sulfate 325 (65 FE) MG EC tablet, Take 325 mg by mouth 3 (three) times daily with meals., Disp: , Rfl:  .  glucose blood test strip, 1 each by Other route as needed for other. Use as instructed, Disp: , Rfl:  .  hydrALAZINE (APRESOLINE) 50 MG tablet, Take 50 mg by mouth 3 (three) times daily., Disp: , Rfl:  .  insulin glargine (LANTUS) 100 UNIT/ML injection, Inject 25 Units into the skin 2 (two) times daily., Disp: , Rfl:  .  insulin regular (NOVOLIN R,HUMULIN R) 100 units/mL injection, Inject 15 Units into the skin 2 (two) times daily before a meal., Disp: , Rfl:  .  isosorbide mononitrate (IMDUR) 60 MG 24 hr tablet, Take 60 mg by mouth every morning., Disp: , Rfl:  .  magnesium oxide (MAG-OX) 400 MG tablet, Take 400 mg by mouth daily.,  Disp: , Rfl:  .  metoprolol (TOPROL-XL) 200 MG 24 hr tablet, Take 100 mg by mouth daily., Disp: , Rfl:  .  nitroGLYCERIN (NITROSTAT) 0.4 MG SL tablet, Place 0.4 mg under the tongue every 5 (five) minutes as needed for chest pain., Disp: , Rfl:  .  omeprazole (PRILOSEC) 20 MG capsule, Take 20 mg by mouth every morning., Disp: , Rfl:  .  ranitidine (ZANTAC) 150 MG tablet, Take 150 mg by mouth 2 (two) times daily as needed for heartburn., Disp: , Rfl:  .  simethicone (MYLICON) 80 MG chewable tablet, Chew 80 mg by mouth 3 (three) times daily as needed for flatulence., Disp: , Rfl:  .  sodium bicarbonate 650 MG tablet, Take 650 mg by mouth 2 (two) times daily., Disp: , Rfl:   Past Medical History: No past medical history on file.  Tobacco Use: Social History   Tobacco Use  Smoking Status Never Smoker  Smokeless Tobacco Never Used    Labs: Recent Review Flowsheet Data    There is no flowsheet data to display.       Exercise Target Goals: Exercise Program Goal: Individual exercise prescription set using results from initial 6 min walk test and THRR while considering  patient's activity barriers and safety.   Exercise Prescription Goal: Initial exercise prescription builds to  30-45 minutes a day of aerobic activity, 2-3 days per week.  Home exercise guidelines will be given to patient during program as part of exercise prescription that the participant will acknowledge.  Activity Barriers & Risk Stratification: Activity Barriers & Cardiac Risk Stratification - 11/23/17 1251      Activity Barriers & Cardiac Risk Stratification   Activity Barriers  Arthritis;Neck/Spine Problems;Deconditioning;Muscular Weakness    Cardiac Risk Stratification  High       6 Minute Walk: 6 Minute Walk    Row Name 11/23/17 1427         6 Minute Walk   Phase  Initial     Distance  1115 feet     Walk Time  6 minutes     # of Rest Breaks  0     MPH  2.11     METS  2.89     RPE  12     VO2 Peak   10.09     Symptoms  No     Resting HR  75 bpm     Resting BP  146/60     Resting Oxygen Saturation   99 %     Exercise Oxygen Saturation  during 6 min walk  97 %     Max Ex. HR  119 bpm     Max Ex. BP  158/84     2 Minute Post BP  134/64        Oxygen Initial Assessment:   Oxygen Re-Evaluation:   Oxygen Discharge (Final Oxygen Re-Evaluation):   Initial Exercise Prescription: Initial Exercise Prescription - 11/23/17 1400      Date of Initial Exercise RX and Referring Provider   Date  11/23/17    Referring Provider  Barbie Haggis MD ([place])      Treadmill   MPH  2    Grade  0.5    Minutes  15    METs  2.67      NuStep   Level  2    SPM  80    Minutes  15    METs  2      Biostep-RELP   Level  2    SPM  50    Minutes  15    METs  2      Prescription Details   Frequency (times per week)  2    Duration  Progress to 30 minutes of continuous aerobic without signs/symptoms of physical distress      Intensity   THRR 40-80% of Max Heartrate  105-135    Ratings of Perceived Exertion  11-13    Perceived Dyspnea  0-4      Progression   Progression  Continue to progress workloads to maintain intensity without signs/symptoms of physical distress.      Resistance Training   Training Prescription  Yes    Weight  3 lbs    Reps  10-15       Perform Capillary Blood Glucose checks as needed.  Exercise Prescription Changes: Exercise Prescription Changes    Row Name 11/23/17 1300 12/01/17 1400 12/10/17 0800 12/15/17 1400       Response to Exercise   Blood Pressure (Admit)  146/60  140/66  -  155/60    Blood Pressure (Exercise)  158/84  154/66  -  166/68    Blood Pressure (Exit)  134/64  144/50  -  154/58    Heart Rate (Admit)  75 bpm  99 bpm  -  81 bpm    Heart Rate (Exercise)  119 bpm  121 bpm  -  115 bpm    Heart Rate (Exit)  81 bpm  94 bpm  -  86 bpm    Oxygen Saturation (Admit)  99 %  -  -  -    Oxygen Saturation (Exercise)  97 %  -  -  -    Rating of  Perceived Exertion (Exercise)  12  13  -  13    Symptoms  none  none  -  none    Comments  walk test results  second full day of exercise  -  -    Duration  -  Continue with 30 min of aerobic exercise without signs/symptoms of physical distress.  -  Continue with 30 min of aerobic exercise without signs/symptoms of physical distress.    Intensity  -  THRR unchanged  -  THRR unchanged      Progression   Progression  -  Continue to progress workloads to maintain intensity without signs/symptoms of physical distress.  -  Continue to progress workloads to maintain intensity without signs/symptoms of physical distress.    Average METs  -  2.18  -  2.38      Resistance Training   Training Prescription  -  Yes  -  Yes    Weight  -  3 lbs  -  4 lbs    Reps  -  10-15  -  10-15      Interval Training   Interval Training  -  No  -  No      Treadmill   MPH  -  1.5  -  1.5    Grade  -  0.5  -  0.5    Minutes  -  15  -  15    METs  -  2.25  -  2.25      NuStep   Level  -  2  -  3    Minutes  -  15  -  15    METs  -  2.3  -  2.9      Biostep-RELP   Level  -  2  -  2    Minutes  -  15  -  15    METs  -  2  -  2      Home Exercise Plan   Plans to continue exercise at  -  -  Home (comment) walking and nustep   Home (comment) walking and nustep     Frequency  -  -  Add 3 additional days to program exercise sessions.  Add 3 additional days to program exercise sessions.    Initial Home Exercises Provided  -  -  12/10/17  12/10/17       Exercise Comments: Exercise Comments    Row Name 11/26/17 [phone] 12/10/17 0827         Exercise Comments  First full day of exercise!  Patient was oriented to gym and equipment including functions, settings, policies, and procedures.  Patient's individual exercise prescription and treatment plan were reviewed.  All starting workloads were established based on the results of the 6 minute walk test done at initial orientation visit.  The plan for exercise  progression was also introduced and progression will be customized based on patient's performance and goals.  Reviewed home exercise with pt today.  Pt plans to walk at  home, and use his nustep for exercise.  Reviewed THR, pulse, RPE, sign and symptoms, NTG use, and when to call 911 or MD.  Also discussed weather considerations and indoor options.  Pt voiced understanding.         Exercise Goals and Review: Exercise Goals    Row Name 11/23/17 1430             Exercise Goals   Increase Physical Activity  Yes       Intervention  Provide advice, education, support and counseling about physical activity/exercise needs.;Develop an individualized exercise prescription for aerobic and resistive training based on initial evaluation findings, risk stratification, comorbidities and participant's personal goals.       Expected Outcomes  Short Term: Attend rehab on a regular basis to increase amount of physical activity.;Long Term: Add in home exercise to make exercise part of routine and to increase amount of physical activity.;Long Term: Exercising regularly at least 3-5 days a week.       Increase Strength and Stamina  Yes       Intervention  Provide advice, education, support and counseling about physical activity/exercise needs.;Develop an individualized exercise prescription for aerobic and resistive training based on initial evaluation findings, risk stratification, comorbidities and participant's personal goals.       Expected Outcomes  Short Term: Increase workloads from initial exercise prescription for resistance, speed, and METs.;Short Term: Perform resistance training exercises routinely during rehab and add in resistance training at home;Long Term: Improve cardiorespiratory fitness, muscular endurance and strength as measured by increased METs and functional capacity (6MWT)       Able to understand and use rate of perceived exertion (RPE) scale  Yes       Intervention  Provide education and  explanation on how to use RPE scale       Expected Outcomes  Short Term: Able to use RPE daily in rehab to express subjective intensity level;Long Term:  Able to use RPE to guide intensity level when exercising independently       Knowledge and understanding of Target Heart Rate Range (THRR)  Yes       Intervention  Provide education and explanation of THRR including how the numbers were predicted and where they are located for reference       Expected Outcomes  Short Term: Able to use daily as guideline for intensity in rehab;Long Term: Able to use THRR to govern intensity when exercising independently;Short Term: Able to state/look up THRR       Able to check pulse independently  Yes       Intervention  Review the importance of being able to check your own pulse for safety during independent exercise;Provide education and demonstration on how to check pulse in carotid and radial arteries.       Expected Outcomes  Short Term: Able to explain why pulse checking is important during independent exercise;Long Term: Able to check pulse independently and accurately       Understanding of Exercise Prescription  Yes       Intervention  Provide education, explanation, and written materials on patient's individual exercise prescription       Expected Outcomes  Short Term: Able to explain program exercise prescription;Long Term: Able to explain home exercise prescription to exercise independently          Exercise Goals Re-Evaluation : Exercise Goals Re-Evaluation    Row Name 11/26/17 [phone] 12/01/17 1456 12/03/17 0830 12/10/17 0828 12/15/17 1418  Exercise Goal Re-Evaluation   Exercise Goals Review  Increase Physical Activity;Increase Strength and Stamina;Able to understand and use rate of perceived exertion (RPE) scale;Knowledge and understanding of Target Heart Rate Range (THRR);Understanding of Exercise Prescription  Increase Physical Activity;Increase Strength and Stamina;Understanding of Exercise  Prescription  Increase Physical Activity;Increase Strength and Stamina;Understanding of Exercise Prescription  Increase Physical Activity;Increase Strength and Stamina;Understanding of Exercise Prescription  Increase Physical Activity;Increase Strength and Stamina;Understanding of Exercise Prescription   Comments  Reviewed RPE scale, THR and program prescription with pt today.  Pt voiced understanding and was given a copy of goals to take home.   Todd Barr is off to a good start in rehab.  He has completed his first two days in rehab.  We will continue to monitor his progression.   Todd Barr states that he feels like he is doing okay so far with exercise. He reports feeling stronger already and is pleased with his progress. He will be having surgery on his left arm September 3, which he doesn't want to let set him back.   Reviewed home exercise with pt today.  Pt plans to walk at home, and use his nustep for exercise.  Reviewed THR, pulse, RPE, sign and symptoms, NTG use, and when to call 911 or MD.  Also discussed weather considerations and indoor options.  Pt voiced understanding.  Todd Barr has been doing well in rehab.  He has moved up on the NuStep to level 3.  We will continue to monitor his progress.    Expected Outcomes  Short: Use RPE daily to regulate intensity. Long: Follow program prescription in THR.  Short: Continue to attend rehab regularly.  Long: Continue to follow program prescription.   Short: continue to attend rehab regularly. Long: become independent on the exercise equipment.   Short: add an additonal 3 days of exercise at home Long: increase overall activity level   Short: Increase workload on treadmill.  Long: Continue to improve strength and stamina.    [place] Name 12/28/17 1623 01/11/18 1520 01/25/18 1552 02/02/18 1201       Exercise Goal Re-Evaluation   Comments  Out since last review  Out since last review  Out since last review  Out since last review       Discharge Exercise Prescription  (Final Exercise Prescription Changes): Exercise Prescription Changes - 12/15/17 1400      Response to Exercise   Blood Pressure (Admit)  155/60    Blood Pressure (Exercise)  166/68    Blood Pressure (Exit)  154/58    Heart Rate (Admit)  81 bpm    Heart Rate (Exercise)  115 bpm    Heart Rate (Exit)  86 bpm    Rating of Perceived Exertion (Exercise)  13    Symptoms  none    Duration  Continue with 30 min of aerobic exercise without signs/symptoms of physical distress.    Intensity  THRR unchanged      Progression   Progression  Continue to progress workloads to maintain intensity without signs/symptoms of physical distress.    Average METs  2.38      Resistance Training   Training Prescription  Yes    Weight  4 lbs    Reps  10-15      Interval Training   Interval Training  No      Treadmill   MPH  1.5    Grade  0.5    Minutes  15    METs  2.25  NuStep   Level  3    Minutes  15    METs  2.9      Biostep-RELP   Level  2    Minutes  15    METs  2      Home Exercise Plan   Plans to continue exercise at  Home (comment)   walking and nustep    Frequency  Add 3 additional days to program exercise sessions.    Initial Home Exercises Provided  12/10/17       Nutrition:  Target Goals: Understanding of nutrition guidelines, daily intake of sodium '1500mg'$ , cholesterol '200mg'$ , calories 30% from fat and 7% or less from saturated fats, daily to have 5 or more servings of fruits and vegetables.  Biometrics: Pre Biometrics - 11/23/17 1431      Pre Biometrics   Height  5' 9.4" (1.763 m)    Weight  205 lb 4.8 oz (93.1 kg)    Waist Circumference  42.5 inches    Hip Circumference  41.5 inches    Waist to Hip Ratio  1.02 %    BMI (Calculated)  29.96    Single Leg Stand  14.05 seconds        Nutrition Therapy Plan and Nutrition Goals: Nutrition Therapy & Goals - 11/26/17 0843      Nutrition Therapy   Diet  DM    Drug/Food Interactions  Statins/Certain Fruits     Protein (specify units)  12oz    Fiber  30 grams    Whole Grain Foods  3 servings   eats some whole grains, likes sourdough bread   Saturated Fats  15 max. grams    Fruits and Vegetables  5 servings/day   8 ideal, eats 2 meals/day typically   Sodium  1500 grams      Personal Nutrition Goals   Nutrition Goal  Become more familiar with foods high in potassium and phosphorus. Limit or avoid these foods d/t dialysis treatments   handouts provided which listed foods high in potassium and phosphorus   Personal Goal #2  Eat on a consistent schedule each day, trying not to skip meals or go long periods of time without eating in order to best manage BG levels. Keeping your new schedule of adding breakfast daily would be ideal    Personal Goal #3  Continue to limit sodium intake, paying particular attention to canned items and pre-prepared meals. Upper limit range for sodium on a frozen/ pre-prepared meal is 500-'600mg'$  per meal    Comments  He monitors sodium and total fluid intake d/t dialysis treatments. He also avoids some foods with potassium and phosphorus, in addition to being on medications which limit absorption. He does not follow a diabetic diet but does try to limit his sugar intake and keep carbs "lower"      Intervention Plan   Intervention  Nutrition handout(s) given to patient.;Prescribe, educate and counsel regarding individualized specific dietary modifications aiming towards targeted core components such as weight, hypertension, lipid management, diabetes, heart failure and other comorbidities.   Potssium & sodium content of foods, Controlling your phosphorus    Expected Outcomes  Short Term Goal: Understand basic principles of dietary content, such as calories, fat, sodium, cholesterol and nutrients.;Short Term Goal: A plan has been developed with personal nutrition goals set during dietitian appointment.;Long Term Goal: Adherence to prescribed nutrition plan.       Nutrition  Assessments: Nutrition Assessments - 11/23/17 1254      MEDFICTS  Scores   Pre Score  52       Nutrition Goals Re-Evaluation: Nutrition Goals Re-Evaluation    [place] Name 11/26/17 0909 12/03/17 0836           Goals   Current Weight  -  207 lb (93.9 kg)      Nutrition Goal  Become more familiar with foods high in potassium and phosphorus. Limit or avoid these foods d/t dialysis treatments  Still working on becoming familiar with food that contain potassium.       Comment  He is on dialysis and monitors potassium and phosphorus in foods to a certain extent but could use a more in-depth review of foods and beverages that contain these two micronutrients  Todd Barr says he's been watchig out for foods that are high in potassium for some time now -- says his bllod levels were great the last time he had dialysis.       Expected Outcome  He will avoid most foods high in potassium and phosphorus, and limit foods moderate in these micronutrients  Short: become more aware of foods high in potassium Long: avoid foods high in potassium         Personal Goal #2 Re-Evaluation   Personal Goal #2  Eat on a consistent schedule each day, trying not to skip meals or go long periods of time without eating to better manage BG levels. Keeping your new schedule of adding breakfast daily would be ideal  -        Personal Goal #3 Re-Evaluation   Personal Goal #3  Continue to limit sodium intake- paying particular attention to canned items and pre-prepared meals. Upper limit range for sodium on a frozen/ pre-pepared meal is 500-'600mg'$  per meal  -         Nutrition Goals Discharge (Final Nutrition Goals Re-Evaluation): Nutrition Goals Re-Evaluation - 12/03/17 0836      Goals   Current Weight  207 lb (93.9 kg)    Nutrition Goal  Still working on becoming familiar with food that contain potassium.     Comment  Todd Barr says he's been watchig out for foods that are high in potassium for some time now -- says his bllod levels  were great the last time he had dialysis.     Expected Outcome  Short: become more aware of foods high in potassium Long: avoid foods high in potassium        Psychosocial: Target Goals: Acknowledge presence or absence of significant depression and/or stress, maximize coping skills, provide positive support system. Participant is able to verbalize types and ability to use techniques and skills needed for reducing stress and depression.   Initial Review & Psychosocial Screening: Initial Psych Review & Screening - 11/23/17 1252      Initial Review   Current issues with  Current Stress Concerns    Source of Stress Concerns  Chronic Illness;Unable to perform yard/household activities    Comments  Todd Barr has just been placed on Dialysis (T, Th, Sa) this past month. He is supposed to go for a fistula in October. This has really been hard. He reports a lot of his issues he does not know if it is coming from his recent heart attack or his failing kindeys.       Family Dynamics   Good Support System?  Yes   spouse     Screening Interventions   Interventions  Encouraged to exercise;Program counselor consult;To provide support and resources with identified psychosocial  needs;Provide feedback about the scores to participant    Expected Outcomes  Short Term goal: Utilizing psychosocial counselor, staff and physician to assist with identification of specific Stressors or current issues interfering with healing process. Setting desired goal for each stressor or current issue identified.;Long Term Goal: Stressors or current issues are controlled or eliminated.;Short Term goal: Identification and review with participant of any Quality of Life or Depression concerns found by scoring the questionnaire.;Long Term goal: The participant improves quality of Life and PHQ9 Scores as seen by post scores and/or verbalization of changes       Quality of Life Scores:  Quality of Life - 11/23/17 1253      Quality of  Life   Select  Quality of Life      Quality of Life Scores   Health/Function Pre  19.33 %    Socioeconomic Pre  20.63 %    Psych/Spiritual Pre  24.29 %    Family Pre  30 %    GLOBAL Pre  22.14 %      Scores of 19 and below usually indicate a poorer quality of life in these areas.  A difference of  2-3 points is a clinically meaningful difference.  A difference of 2-3 points in the total score of the Quality of Life Index has been associated with significant improvement in overall quality of life, self-image, physical symptoms, and general health in studies assessing change in quality of life.  PHQ-9: Recent Review Flowsheet Data    Depression screen [facility] 2/9 11/23/2017   Decreased Interest 0   Down, Depressed, Hopeless 0   PHQ - 2 Score 0   Altered sleeping 0   Tired, decreased energy 2   Change in appetite 0   Feeling bad or failure about yourself  0   Trouble concentrating 0   Moving slowly or fidgety/restless 0   Suicidal thoughts 0   PHQ-9 Score 2   Difficult doing work/chores Somewhat difficult     Interpretation of Total Score  Total Score Depression Severity:  1-4 = Minimal depression, 5-9 = Mild depression, 10-14 = Moderate depression, 15-19 = Moderately severe depression, 20-27 = Severe depression   Psychosocial Evaluation and Intervention: Psychosocial Evaluation - 12/10/17 1002      Psychosocial Evaluation & Interventions   Interventions  Encouraged to exercise with the program and follow exercise prescription    Comments  Counselor met with Mr. Skalicky today for initial psychosocial evaluation.  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  Todd Barr has a strong support system with a spouse of 20 years; a son in [place] and active involvement in his local church community.  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  Todd Barr sleeps well with a CPAP and has a good appetite.   He denies a history of depression or anxiety or any current symptoms and states he is typically in a positive mood.  Todd Barr's health is reportedly his primary stressor at this time.  He has goals to increase his stamina while in this program.  Staff will follow with him.    Expected Outcomes  Short:  Todd Barr will exercise consistently to increase his stamina and for his mental health as a positive coping strategy for stress.   Long:  Todd Barr will develop a positive and healthy lifestyle routine of exercise and diet and stress  management..    Continue Psychosocial Services   Follow up required by staff       Psychosocial Re-Evaluation: Psychosocial Re-Evaluation    [place] Name 12/03/17 [phone]             Psychosocial Re-Evaluation   Current issues with  None Identified       Comments  Todd Barr is in good spirits. He has no trouble sleeping, he uses a CPAP and has an inclined bed that helps.        Expected Outcomes  Short: continue to be in good spirits no matter what comes his way Long: stay stress free            Psychosocial Discharge (Final Psychosocial Re-Evaluation): Psychosocial Re-Evaluation - 12/03/17 7989      Psychosocial Re-Evaluation   Current issues with  None Identified    Comments  Todd Barr is in good spirits. He has no trouble sleeping, he uses a CPAP and has an inclined bed that helps.     Expected Outcomes  Short: continue to be in good spirits no matter what comes his way Long: stay stress free         Vocational Rehabilitation: Provide vocational rehab assistance to qualifying candidates.   Vocational Rehab Evaluation & Intervention: Vocational Rehab - 11/23/17 1255      Initial Vocational Rehab Evaluation & Intervention   Assessment shows need for Vocational Rehabilitation  No       Education: Education Goals: Education classes will be provided on a variety of topics geared toward better understanding of heart health and risk factor modification. Participant will state  understanding/return demonstration of topics presented as noted by education test scores.  Learning Barriers/Preferences: Learning Barriers/Preferences - 11/23/17 1254      Learning Barriers/Preferences   Learning Barriers  None    Learning Preferences  Individual Instruction;Written Material       Education Topics:  AED/CPR: - Group verbal and written instruction with the use of models to demonstrate the basic use of the AED with the basic ABC's of resuscitation.   General Nutrition Guidelines/Fats and Fiber: -Group instruction provided by verbal, written material, models and posters to present the general guidelines for heart healthy nutrition. Gives an explanation and review of dietary fats and fiber.   Controlling Sodium/Reading Food Labels: -Group verbal and written material supporting the discussion of sodium use in heart healthy nutrition. Review and explanation with models, verbal and written materials for utilization of the food label.   Exercise Physiology & General Exercise Guidelines: - Group verbal and written instruction with models to review the exercise physiology of the cardiovascular system and associated critical values. Provides general exercise guidelines with specific guidelines to those with heart or lung disease.    Aerobic Exercise & Resistance Training: - Gives group verbal and written instruction on the various components of exercise. Focuses on aerobic and resistive training programs and the benefits of this training and how to safely progress through these programs..   Flexibility, Balance, Mind/Body Relaxation: Provides group verbal/written instruction on the benefits of flexibility and balance training, including mind/body exercise modes such as yoga, pilates and tai chi.  Demonstration and skill practice provided.   Stress and Anxiety: - Provides group verbal and written instruction about the health risks of elevated stress and causes of high  stress.  Discuss the correlation between heart/lung disease and anxiety and treatment options. Review healthy ways to manage with stress and anxiety.   Cardiac Rehab from 12/10/2017 in  [facility]  Date  12/01/17  Educator  [facility]  Instruction Review Code  1- Verbalizes Understanding      Depression: - Provides group verbal and written instruction on the correlation between heart/lung disease and depressed mood, treatment options, and the stigmas associated with seeking treatment.   Anatomy & Physiology of the Heart: - Group verbal and written instruction and models provide basic cardiac anatomy and physiology, with the coronary electrical and arterial systems. Review of Valvular disease and Heart Failure   Cardiac Rehab from 12/10/2017 in [facility] Cardiac and Pulmonary Rehab  Date  12/03/17  Educator  CE  Instruction Review Code  1- Verbalizes Understanding      Cardiac Procedures: - Group verbal and written instruction to review commonly prescribed medications for heart disease. Reviews the medication, class of the drug, and side effects. Includes the steps to properly store meds and maintain the prescription regimen. (beta blockers and nitrates)   Cardiac Medications I: - Group verbal and written instruction to review commonly prescribed medications for heart disease. Reviews the medication, class of the drug, and side effects. Includes the steps to properly store meds and maintain the prescription regimen.   Cardiac Medications II: -Group verbal and written instruction to review commonly prescribed medications for heart disease. Reviews the medication, class of the drug, and side effects. (all other drug classes)   Cardiac Rehab from 12/10/2017 in [facility] Cardiac and Pulmonary Rehab  Date  11/26/17  Educator  CE  Instruction Review Code  1- Verbalizes Understanding       Go Sex-Intimacy & Heart Disease, Get SMART - Goal Setting: - Group verbal and written  instruction through game format to discuss heart disease and the return to sexual intimacy. Provides group verbal and written material to discuss and apply goal setting through the application of the S.M.A.R.T. Method.   Other Matters of the Heart: - Provides group verbal, written materials and models to describe Stable Angina and Peripheral Artery. Includes description of the disease process and treatment options available to the cardiac patient.   Cardiac Rehab from 12/10/2017 in [facility] Cardiac and Pulmonary Rehab  Date  12/03/17  Educator  CE  Instruction Review Code  1- Verbalizes Understanding      Exercise & Equipment Safety: - Individual verbal instruction and demonstration of equipment use and safety with use of the equipment.   Cardiac Rehab from 12/10/2017 in [facility] Cardiac and Pulmonary Rehab  Date  11/23/17  Educator  [facility]  Instruction Review Code  1- Verbalizes Understanding      Infection Prevention: - Provides verbal and written material to individual with discussion of infection control including proper hand washing and proper equipment cleaning during exercise session.   Cardiac Rehab from 12/10/2017 in [facility] Cardiac and Pulmonary Rehab  Date  11/23/17  Educator  [facility]  Instruction Review Code  1- Verbalizes Understanding      Falls Prevention: - Provides verbal and written material to individual with discussion of falls prevention and safety.   Cardiac Rehab from 12/10/2017 in [facility]  Date  11/23/17  Educator  [facility]  Instruction Review Code  1- Verbalizes Understanding      Diabetes: - Individual verbal and written instruction to review signs/symptoms of diabetes, desired ranges of glucose level fasting, after meals and with exercise. Acknowledge that pre and post exercise glucose checks will be done for 3 sessions at entry of program.   Cardiac Rehab from 12/10/2017 in [facility] Cardiac  and Pulmonary Rehab  Date  11/23/17  Educator  [facility]  Instruction  Review Code  1- Verbalizes Understanding      Know Your Numbers and Risk Factors: -Group verbal and written instruction about important numbers in your health.  Discussion of what are risk factors and how they play a role in the disease process.  Review of Cholesterol, Blood Pressure, Diabetes, and BMI and the role they play in your overall health.   Cardiac Rehab from 12/10/2017 in [facility] Cardiac and Pulmonary Rehab  Date  11/26/17  Educator  CE  Instruction Review Code  1- Verbalizes Understanding      Sleep Hygiene: -Provides group verbal and written instruction about how sleep can affect your health.  Define sleep hygiene, discuss sleep cycles and impact of sleep habits. Review good sleep hygiene tips.    Other: -Provides group and verbal instruction on various topics (see comments)   Knowledge Questionnaire Score: Knowledge Questionnaire Score - 11/23/17 1254      Knowledge Questionnaire Score   Pre Score  25/26   correct answers reviewed with patient, focus on exercise      Core Components/Risk Factors/Patient Goals at Admission: Personal Goals and Risk Factors at Admission - 11/23/17 1255      Core Components/Risk Factors/Patient Goals on Admission    Weight Management  Yes;Weight Loss    Intervention  Weight Management: Develop a combined nutrition and exercise program designed to reach desired caloric intake, while maintaining appropriate intake of nutrient and fiber, sodium and fats, and appropriate energy expenditure required for the weight goal.;Weight Management: Provide education and appropriate resources to help participant work on and attain dietary goals.;Weight Management/Obesity: Establish reasonable short term and long term weight goals.    Admit Weight  205 lb (93 kg)    Goal Weight: Short Term  200 lb (90.7 kg)    Goal Weight: Long Term  200 lb (90.7 kg)    Expected Outcomes  Short Term: Continue to assess and modify interventions until short term weight is  achieved;Long Term: Adherence to nutrition and physical activity/exercise program aimed toward attainment of established weight goal;Weight Loss: Understanding of general recommendations for a balanced deficit meal plan, which promotes 1-2 lb weight loss per week and includes a negative energy balance of [phone] kcal/d;Understanding recommendations for meals to include 15-35% energy as protein, 25-35% energy from fat, 35-60% energy from carbohydrates, less than '200mg'$  of dietary cholesterol, 20-35 gm of total fiber daily;Understanding of distribution of calorie intake throughout the day with the consumption of 4-5 meals/snacks    Diabetes  Yes    Intervention  Provide education about signs/symptoms and action to take for hypo/hyperglycemia.;Provide education about proper nutrition, including hydration, and aerobic/resistive exercise prescription along with prescribed medications to achieve blood glucose in normal ranges: Fasting glucose 65-99 mg/dL    Expected Outcomes  Short Term: Participant verbalizes understanding of the signs/symptoms and immediate care of hyper/hypoglycemia, proper foot care and importance of medication, aerobic/resistive exercise and nutrition plan for blood glucose control.;Long Term: Attainment of HbA1C < 7%.    Hypertension  Yes    Intervention  Monitor prescription use compliance.;Provide education on lifestyle modifcations including regular physical activity/exercise, weight management, moderate sodium restriction and increased consumption of fresh fruit, vegetables, and low fat dairy, alcohol moderation, and smoking cessation.    Expected Outcomes  Short Term: Continued assessment and intervention until BP is < 140/60m HG in hypertensive participants. < 130/874mHG in hypertensive participants with diabetes, heart failure or  chronic kidney disease.;Long Term: Maintenance of blood pressure at goal levels.    Lipids  Yes    Intervention  Provide education and support for  participant on nutrition & aerobic/resistive exercise along with prescribed medications to achieve LDL '70mg'$ , HDL >'40mg'$ .    Expected Outcomes  Short Term: Participant states understanding of desired cholesterol values and is compliant with medications prescribed. Participant is following exercise prescription and nutrition guidelines.;Long Term: Cholesterol controlled with medications as prescribed, with individualized exercise RX and with personalized nutrition plan. Value goals: LDL < '70mg'$ , HDL > 40 mg.       Core Components/Risk Factors/Patient Goals Review:  Goals and Risk Factor Review    Row Name 12/03/17 [phone]             Core Components/Risk Factors/Patient Goals Review   Personal Goals Review  Weight Management/Obesity;Diabetes;Hypertension       Review  Todd Barr takes his medications as prescribed and is keeping his diabetes under control. 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 He would like to lose a few pounds to feel better.        Expected Outcomes  Short: continue to take medications as directed. Long: lose 5 pounds through diet and exercise           Core Components/Risk Factors/Patient Goals at Discharge (Final Review):  Goals and Risk Factor Review - 12/03/17 0838      Core Components/Risk Factors/Patient Goals Review   Personal Goals Review  Weight Management/Obesity;Diabetes;Hypertension    Review  Todd Barr takes his medications as prescribed and is keeping his diabetes under control. 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 He would like to lose a few pounds to feel better.     Expected Outcomes  Short: continue to take medications as directed. Long: lose 5 pounds through diet and exercise        ITP Comments: ITP Comments    Row Name 11/23/17 1216 11/25/17 0817 12/10/17 0818 12/22/17 1531 12/23/17 0616   ITP Comments  Med Review completed. Initial ITP created. Diagnosis can be found in [facility] from [place]  7/9  30 day review completed. ITP sent to Dr. Ramonita Lab, covering for Dr. Emily Filbert, Medical Director of Cardiac Rehab. Continue with ITP unless changes are made by physician.  New to program.  Has not started to exercise yet.   Todd Barr let us know today that he will be having a fistula placed in his left arm next Tuesday. We talked about making sure he gets clearance before returning to rehab.  Spoke with wife.  Todd Barr had his fistula placed last week.  He has his follow scheduled for next week.  Reminded her that we will need clearance for him to return.   30 day review completed. ITP sent to Dr. Emily Filbert, Medical Director of Cardiac Rehab. Continue with ITP unless changes are made by physician  Out for medical reason   [place] Name 01/11/18 1520 01/20/18 0750 01/25/18 1359 02/02/18 1201 02/17/18 0549   ITP Comments  Called to check on patient.  He has a follow up appointment from his fistula placement this Wednesday.  He will need clearance to return to rehab.  He is hoping to be able to come back either this Thursday or next Tuesday.   30 day review completed. ITP sent to Dr. Emily Filbert, Medical Director of Cardiac Rehab. Continue with ITP unless changes are made by physician [place] for  medical reason  Called to check on Todd Barr.  Out since 8/29 for fistula placement.  Was supposed to return last week.  Unable to leave message.  Will give until end of week, then will send letter to discharge.   Called to check on [facility]. [facility] to be cleared to return. Spoke with his wife. He has the appointment date in his phone and will let us know when that is.  He will returned once cleared.   30 day review. Continue with ITP unless direccted changes per Medical Director Chart Review.      Comments:

## 2018-02-18 ENCOUNTER — Encounter: Payer: Self-pay | Admitting: *Deleted

## 2018-02-18 ENCOUNTER — Telehealth: Payer: Self-pay | Admitting: *Deleted

## 2018-02-18 DIAGNOSIS — I214 Non-ST elevation (NSTEMI) myocardial infarction: Secondary | ICD-10-CM

## 2018-02-18 DIAGNOSIS — Z955 Presence of coronary angioplasty implant and graft: Secondary | ICD-10-CM

## 2018-02-18 NOTE — Telephone Encounter (Signed)
Todd Barr called to let us know that he was finally cleared to return to rehab.  He has a letter to bring in clearing him.  He hopes to return on Tuesday 11/12.

## 2018-02-23 ENCOUNTER — Encounter: Payer: No Typology Code available for payment source | Admitting: *Deleted

## 2018-02-23 DIAGNOSIS — Z79899 Other long term (current) drug therapy: Secondary | ICD-10-CM | POA: Diagnosis not present

## 2018-02-23 DIAGNOSIS — I214 Non-ST elevation (NSTEMI) myocardial infarction: Secondary | ICD-10-CM | POA: Diagnosis present

## 2018-02-23 DIAGNOSIS — Z955 Presence of coronary angioplasty implant and graft: Secondary | ICD-10-CM

## 2018-02-23 DIAGNOSIS — Z794 Long term (current) use of insulin: Secondary | ICD-10-CM | POA: Diagnosis not present

## 2018-02-23 LAB — GLUCOSE, CAPILLARY
Glucose-Capillary: 142 mg/dL — ABNORMAL HIGH (ref 70–99)
Glucose-Capillary: 119 mg/dL — ABNORMAL HIGH (ref 70–99)

## 2018-02-23 NOTE — Progress Notes (Signed)
Daily Session Note  Patient Details  Name: Todd Barr MRN: [id number] Date of Birth: April 14, 1946 Referring Provider:     Cardiac Rehab from 11/23/2017 in [facility] Cardiac and Pulmonary Rehab  Referring Provider  Todd Haggis MD ([place])      Encounter Date: 02/23/2018  Check In: Session Check In - 02/23/18 0808      Check-In   Supervising physician immediately available to respond to emergencies  See telemetry face sheet for immediately available ER MD    Location  [facility]-Cardiac & Pulmonary Rehab    Staff Present  Alberteen Sam, MA, RCEP, CCRP, Exercise Physiologist;Amanda Oletta Darter, BA, ACSM CEP, Exercise Physiologist;Susanne Bice, RN, BSN, CCRP;Other   Pryor Montes Durrell   Medication changes reported      No    Fall or balance concerns reported     No    Warm-up and Cool-down  Performed on first and last piece of equipment    Resistance Training Performed  Yes    VAD Patient?  No    PAD/SET Patient?  No      Pain Assessment   Currently in Pain?  No/denies          Social History   Tobacco Use  Smoking Status Never Smoker  Smokeless Tobacco Never Used    Goals Met:  Exercise tolerated well Personal goals reviewed No report of cardiac concerns or symptoms Strength training completed today  Goals Unmet:  Not Applicable  Comments: Pt able to follow exercise prescription today without complaint.  Will continue to monitor for progression. Todd Barr returned today for first time since fistula was placed.  He did bring back a note releasing him and it was placed in his chart.     Dr. Emily Filbert is Medical Director for [place] and [facility].

## 2018-02-25 DIAGNOSIS — I214 Non-ST elevation (NSTEMI) myocardial infarction: Secondary | ICD-10-CM | POA: Diagnosis not present

## 2018-02-25 DIAGNOSIS — Z955 Presence of coronary angioplasty implant and graft: Secondary | ICD-10-CM

## 2018-02-25 NOTE — Progress Notes (Signed)
Daily Session Note  Patient Details  Name: Todd Barr MRN: [id number] Date of Birth: 1946/01/03 Referring Provider:     Cardiac Rehab from 11/23/2017 in [facility] Cardiac and Pulmonary Rehab  Referring Provider  Barbie Haggis MD ([place])      Encounter Date: 02/25/2018  Check In: Session Check In - 02/25/18 0828      Check-In   Supervising physician immediately available to respond to emergencies  See telemetry face sheet for immediately available ER MD    Location  [facility]-Cardiac & Pulmonary Rehab    Staff Present  Gerlene Burdock, RN, BSN;Jessica Luan Pulling, MA, RCEP, CCRP, Exercise Physiologist;Ilijah Doucet Oletta Darter, BA, ACSM CEP, Exercise Physiologist    Medication changes reported      No    Fall or balance concerns reported     No    Warm-up and Cool-down  Performed on first and last piece of equipment    Resistance Training Performed  Yes    VAD Patient?  No    PAD/SET Patient?  No      Pain Assessment   Currently in Pain?  No/denies    Multiple Pain Sites  No          Social History   Tobacco Use  Smoking Status Never Smoker  Smokeless Tobacco Never Used    Goals Met:  Independence with exercise equipment Exercise tolerated well No report of cardiac concerns or symptoms Strength training completed today  Goals Unmet:  Not Applicable  Comments: Pt able to follow exercise prescription today without complaint.  Will continue to monitor for progression.    Dr. Emily Filbert is Medical Director for [place] and [facility].

## 2018-03-02 DIAGNOSIS — I214 Non-ST elevation (NSTEMI) myocardial infarction: Secondary | ICD-10-CM | POA: Diagnosis not present

## 2018-03-02 DIAGNOSIS — Z955 Presence of coronary angioplasty implant and graft: Secondary | ICD-10-CM

## 2018-03-02 NOTE — Progress Notes (Signed)
Daily Session Note  Patient Details  Name: Todd Barr MRN: [id number] Date of Birth: 1946/01/28 Referring Provider:     Cardiac Rehab from 11/23/2017 in [facility] Cardiac and Pulmonary Rehab  Referring Provider  Barbie Haggis MD ([place])      Encounter Date: 03/02/2018  Check In: Session Check In - 03/02/18 0835      Check-In   Supervising physician immediately available to respond to emergencies  See telemetry face sheet for immediately available ER MD    Location  [facility]-Cardiac & Pulmonary Rehab    Staff Present  Jasper Loser BS, Exercise Physiologist;Susanne Bice, RN, BSN, CCRP;Jessica [place], MA, RCEP, CCRP, Exercise Physiologist    Medication changes reported      No    Fall or balance concerns reported     No    Warm-up and Cool-down  Performed on first and last piece of equipment    Resistance Training Performed  Yes    VAD Patient?  No      Pain Assessment   Currently in Pain?  No/denies          Social History   Tobacco Use  Smoking Status Never Smoker  Smokeless Tobacco Never Used    Goals Met:  Independence with exercise equipment Exercise tolerated well Personal goals reviewed No report of cardiac concerns or symptoms Strength training completed today  Goals Unmet:  Not Applicable  Comments: Pt able to follow exercise prescription today without complaint.  Will continue to monitor for progression.   Dr. Emily Filbert is Medical Director for [place] and [facility].

## 2018-03-04 ENCOUNTER — Encounter: Payer: No Typology Code available for payment source | Admitting: *Deleted

## 2018-03-04 DIAGNOSIS — I214 Non-ST elevation (NSTEMI) myocardial infarction: Secondary | ICD-10-CM

## 2018-03-04 DIAGNOSIS — Z955 Presence of coronary angioplasty implant and graft: Secondary | ICD-10-CM

## 2018-03-04 NOTE — Progress Notes (Signed)
Daily Session Note  Patient Details  Name: Said Rueb MRN: [id number] Date of Birth: [birth year] Referring Provider:     Cardiac Rehab from 11/23/2017 in [facility] Cardiac and Pulmonary Rehab  Referring Provider  Barbie Haggis MD ([place])      Encounter Date: 03/04/2018  Check In: Session Check In - 03/04/18 0831      Check-In   Supervising physician immediately available to respond to emergencies  See telemetry face sheet for immediately available ER MD    Location  [facility]-Cardiac & Pulmonary Rehab    Staff Present  Gerlene Burdock, RN, BSN;Jeanna Durrell BS, Exercise Physiologist    Medication changes reported      No    Fall or balance concerns reported     No    Warm-up and Cool-down  Performed on first and last piece of equipment    Resistance Training Performed  Yes    VAD Patient?  No    PAD/SET Patient?  No      Pain Assessment   Currently in Pain?  No/denies          Social History   Tobacco Use  Smoking Status Never Smoker  Smokeless Tobacco Never Used    Goals Met:  Independence with exercise equipment Exercise tolerated well No report of cardiac concerns or symptoms Strength training completed today  Goals Unmet:  Not Applicable  Comments: Pt able to follow exercise prescription today without complaint.  Will continue to monitor for progression.    Dr. Emily Filbert is Medical Director for [place] and [facility].

## 2018-03-08 ENCOUNTER — Encounter: Payer: No Typology Code available for payment source | Admitting: *Deleted

## 2018-03-08 DIAGNOSIS — I214 Non-ST elevation (NSTEMI) myocardial infarction: Secondary | ICD-10-CM | POA: Diagnosis not present

## 2018-03-08 DIAGNOSIS — Z955 Presence of coronary angioplasty implant and graft: Secondary | ICD-10-CM

## 2018-03-08 NOTE — Progress Notes (Signed)
Daily Session Note  Patient Details  Name: Todd Barr MRN: [id number] Date of Birth: 1945-09-05 Referring Provider:     Cardiac Rehab from 11/23/2017 in [facility] Cardiac and Pulmonary Rehab  Referring Provider  Barbie Haggis MD ([place])      Encounter Date: 03/08/2018  Check In: Session Check In - 03/08/18 0820      Check-In   Supervising physician immediately available to respond to emergencies  See telemetry face sheet for immediately available ER MD    Location  [facility]-Cardiac & Pulmonary Rehab    Staff Present  Earlean Shawl, BS, ACSM CEP, Exercise Physiologist;Jessica Luan Pulling, MA, RCEP, CCRP, Exercise Physiologist;Susanne Bice, RN, BSN, CCRP    Medication changes reported      No    Fall or balance concerns reported     No    Tobacco Cessation  No Change    Warm-up and Cool-down  Performed on first and last piece of equipment    Resistance Training Performed  Yes    VAD Patient?  No    PAD/SET Patient?  No      Pain Assessment   Currently in Pain?  No/denies    Multiple Pain Sites  No          Social History   Tobacco Use  Smoking Status Never Smoker  Smokeless Tobacco Never Used    Goals Met:  Independence with exercise equipment Exercise tolerated well No report of cardiac concerns or symptoms Strength training completed today  Goals Unmet:  Not Applicable  Comments: Pt able to follow exercise prescription today without complaint.  Will continue to monitor for progression.    Dr. Emily Filbert is Medical Director for [place] and [facility].

## 2018-03-10 DIAGNOSIS — I214 Non-ST elevation (NSTEMI) myocardial infarction: Secondary | ICD-10-CM

## 2018-03-10 NOTE — Progress Notes (Signed)
Daily Session Note  Patient Details  Name: Todd Barr MRN: [id number] Date of Birth: 1945-12-17 Referring Provider:     Cardiac Rehab from 11/23/2017 in [facility]  Referring Provider  Barbie Haggis MD ([place])      Encounter Date: 03/10/2018  Check In: Session Check In - 03/10/18 0851      Check-In   Supervising physician immediately available to respond to emergencies  See telemetry face sheet for immediately available ER MD    Location  [facility]    Staff Present  Heath Lark, RN, BSN, CCRP;Jahquan Klugh RCP,RRT,BSRT;Laureen York, [place], RRT, Respiratory Therapist    Medication changes reported      No    Fall or balance concerns reported     No    Warm-up and Cool-down  Performed as group-led instruction    Resistance Training Performed  Yes    VAD Patient?  No    PAD/SET Patient?  No      Pain Assessment   Currently in Pain?  No/denies          Social History   Tobacco Use  Smoking Status Never Smoker  Smokeless Tobacco Never Used    Goals Met:  Independence with exercise equipment Exercise tolerated well No report of cardiac concerns or symptoms Strength training completed today  Goals Unmet:  Not Applicable  Comments: Pt able to follow exercise prescription today without complaint.  Will continue to monitor for progression.    Dr. Emily Filbert is Medical Director for [facility].

## 2018-03-16 ENCOUNTER — Encounter: Payer: No Typology Code available for payment source | Attending: Internal Medicine

## 2018-03-16 DIAGNOSIS — I214 Non-ST elevation (NSTEMI) myocardial infarction: Secondary | ICD-10-CM | POA: Insufficient documentation

## 2018-03-16 DIAGNOSIS — Z955 Presence of coronary angioplasty implant and graft: Secondary | ICD-10-CM | POA: Insufficient documentation

## 2018-03-16 DIAGNOSIS — Z79899 Other long term (current) drug therapy: Secondary | ICD-10-CM | POA: Insufficient documentation

## 2018-03-16 DIAGNOSIS — Z794 Long term (current) use of insulin: Secondary | ICD-10-CM | POA: Insufficient documentation

## 2018-03-17 ENCOUNTER — Encounter: Payer: Self-pay | Admitting: *Deleted

## 2018-03-17 DIAGNOSIS — Z955 Presence of coronary angioplasty implant and graft: Secondary | ICD-10-CM

## 2018-03-17 DIAGNOSIS — I214 Non-ST elevation (NSTEMI) myocardial infarction: Secondary | ICD-10-CM

## 2018-03-17 NOTE — Progress Notes (Signed)
Cardiac Individual Treatment Plan  Patient Details  Name: Todd Barr MRN: [id number] Date of Birth: 24-Jan-1946 Referring Provider:     Cardiac Rehab from 11/23/2017 in [facility] Cardiac and Pulmonary Rehab  Referring Provider  Barbie Haggis MD ([place])      Initial Encounter Date:    Cardiac Rehab from 11/23/2017 in [facility] Cardiac and Pulmonary Rehab  Date  11/23/17      Visit Diagnosis: NSTEMI (non-ST elevated myocardial infarction) [facility])  Status post coronary artery stent placement  Patient's Home Medications on Admission:  Current Outpatient Medications:  .  amLODipine (NORVASC) 10 MG tablet, Take 10 mg by mouth daily., Disp: , Rfl:  .  aspirin 81 MG chewable tablet, Chew by mouth., Disp: , Rfl:  .  atorvastatin (LIPITOR) 80 MG tablet, Take 80 mg by mouth at bedtime., Disp: , Rfl:  .  calcitRIOL (ROCALTROL) 0.25 MCG capsule, Take 0.25 mcg by mouth daily., Disp: , Rfl:  .  calcium acetate (PHOSLO) 667 MG capsule, Take 1,334 mg by mouth 2 (two) times daily with a meal., Disp: , Rfl:  .  Cholecalciferol 1000 units tablet, Take 1,000 Units by mouth daily., Disp: , Rfl:  .  clopidogrel (PLAVIX) 75 MG tablet, Take 75 mg by mouth daily., Disp: , Rfl:  .  ferrous sulfate 325 (65 FE) MG EC tablet, Take 325 mg by mouth 3 (three) times daily with meals., Disp: , Rfl:  .  glucose blood test strip, 1 each by Other route as needed for other. Use as instructed, Disp: , Rfl:  .  hydrALAZINE (APRESOLINE) 50 MG tablet, Take 50 mg by mouth 3 (three) times daily., Disp: , Rfl:  .  insulin glargine (LANTUS) 100 UNIT/ML injection, Inject 25 Units into the skin 2 (two) times daily., Disp: , Rfl:  .  insulin regular (NOVOLIN R,HUMULIN R) 100 units/mL injection, Inject 15 Units into the skin 2 (two) times daily before a meal., Disp: , Rfl:  .  isosorbide mononitrate (IMDUR) 60 MG 24 hr tablet, Take 60 mg by mouth every morning., Disp: , Rfl:  .  magnesium oxide (MAG-OX) 400 MG tablet, Take 400 mg by mouth daily.,  Disp: , Rfl:  .  metoprolol (TOPROL-XL) 200 MG 24 hr tablet, Take 100 mg by mouth daily., Disp: , Rfl:  .  nitroGLYCERIN (NITROSTAT) 0.4 MG SL tablet, Place 0.4 mg under the tongue every 5 (five) minutes as needed for chest pain., Disp: , Rfl:  .  omeprazole (PRILOSEC) 20 MG capsule, Take 20 mg by mouth every morning., Disp: , Rfl:  .  ranitidine (ZANTAC) 150 MG tablet, Take 150 mg by mouth 2 (two) times daily as needed for heartburn., Disp: , Rfl:  .  simethicone (MYLICON) 80 MG chewable tablet, Chew 80 mg by mouth 3 (three) times daily as needed for flatulence., Disp: , Rfl:  .  sodium bicarbonate 650 MG tablet, Take 650 mg by mouth 2 (two) times daily., Disp: , Rfl:   Past Medical History: No past medical history on file.  Tobacco Use: Social History   Tobacco Use  Smoking Status Never Smoker  Smokeless Tobacco Never Used    Labs: Recent Review Flowsheet Data    There is no flowsheet data to display.       Exercise Target Goals: Exercise Program Goal: Individual exercise prescription set using results from initial 6 min walk test and THRR while considering  patient's activity barriers and safety.   Exercise Prescription Goal: Initial exercise prescription builds to  30-45 minutes a day of aerobic activity, 2-3 days per week.  Home exercise guidelines will be given to patient during program as part of exercise prescription that the participant will acknowledge.  Activity Barriers & Risk Stratification: Activity Barriers & Cardiac Risk Stratification - 11/23/17 1251      Activity Barriers & Cardiac Risk Stratification   Activity Barriers  Arthritis;Neck/Spine Problems;Deconditioning;Muscular Weakness    Cardiac Risk Stratification  High       6 Minute Walk: 6 Minute Walk    Row Name 11/23/17 1427         6 Minute Walk   Phase  Initial     Distance  1115 feet     Walk Time  6 minutes     # of Rest Breaks  0     MPH  2.11     METS  2.89     RPE  12     VO2 Peak   10.09     Symptoms  No     Resting HR  75 bpm     Resting BP  146/60     Resting Oxygen Saturation   99 %     Exercise Oxygen Saturation  during 6 min walk  97 %     Max Ex. HR  119 bpm     Max Ex. BP  158/84     2 Minute Post BP  134/64        Oxygen Initial Assessment:   Oxygen Re-Evaluation:   Oxygen Discharge (Final Oxygen Re-Evaluation):   Initial Exercise Prescription: Initial Exercise Prescription - 11/23/17 1400      Date of Initial Exercise RX and Referring Provider   Date  11/23/17    Referring Provider  Barbie Haggis MD ([place])      Treadmill   MPH  2    Grade  0.5    Minutes  15    METs  2.67      NuStep   Level  2    SPM  80    Minutes  15    METs  2      Biostep-RELP   Level  2    SPM  50    Minutes  15    METs  2      Prescription Details   Frequency (times per week)  2    Duration  Progress to 30 minutes of continuous aerobic without signs/symptoms of physical distress      Intensity   THRR 40-80% of Max Heartrate  105-135    Ratings of Perceived Exertion  11-13    Perceived Dyspnea  0-4      Progression   Progression  Continue to progress workloads to maintain intensity without signs/symptoms of physical distress.      Resistance Training   Training Prescription  Yes    Weight  3 lbs    Reps  10-15       Perform Capillary Blood Glucose checks as needed.  Exercise Prescription Changes: Exercise Prescription Changes    Row Name 11/23/17 1300 12/01/17 1400 12/10/17 0800 12/15/17 1400 02/23/18 1300     Response to Exercise   Blood Pressure (Admit)  146/60  140/66  -  155/60  160/64   Blood Pressure (Exercise)  158/84  154/66  -  166/68  170/80   Blood Pressure (Exit)  134/64  144/50  -  154/58  150/70   Heart Rate (Admit)  75 bpm  99  bpm  -  81 bpm  89 bpm   Heart Rate (Exercise)  119 bpm  121 bpm  -  115 bpm  109 bpm   Heart Rate (Exit)  81 bpm  94 bpm  -  86 bpm  96 bpm   Oxygen Saturation (Admit)  99 %  -  -  -  -   Oxygen  Saturation (Exercise)  97 %  -  -  -  -   Rating of Perceived Exertion (Exercise)  12  13  -  13  13   Symptoms  none  none  -  none  none   Comments  walk test results  second full day of exercise  -  -  -   Duration  -  Continue with 30 min of aerobic exercise without signs/symptoms of physical distress.  -  Continue with 30 min of aerobic exercise without signs/symptoms of physical distress.  Continue with 30 min of aerobic exercise without signs/symptoms of physical distress.   Intensity  -  THRR unchanged  -  THRR unchanged  THRR unchanged     Progression   Progression  -  Continue to progress workloads to maintain intensity without signs/symptoms of physical distress.  -  Continue to progress workloads to maintain intensity without signs/symptoms of physical distress.  Continue to progress workloads to maintain intensity without signs/symptoms of physical distress.   Average METs  -  2.18  -  2.38  2.15     Resistance Training   Training Prescription  -  Yes  -  Yes  Yes   Weight  -  3 lbs  -  4 lbs  3 lbs   Reps  -  10-15  -  10-15  10-15     Interval Training   Interval Training  -  No  -  No  No     Treadmill   MPH  -  1.5  -  1.5  -   Grade  -  0.5  -  0.5  -   Minutes  -  15  -  15  -   METs  -  2.25  -  2.25  -     NuStep   Level  -  2  -  3  3   Minutes  -  15  -  15  15   METs  -  2.3  -  2.9  2.3     Biostep-RELP   Level  -  2  -  2  2   Minutes  -  15  -  15  15   METs  -  2  -  2  2     Home Exercise Plan   Plans to continue exercise at  -  -  Home (comment) walking and nustep   Home (comment) walking and nustep   Home (comment) walking and nustep    Frequency  -  -  Add 3 additional days to program exercise sessions.  Add 3 additional days to program exercise sessions.  Add 3 additional days to program exercise sessions.   Initial Home Exercises Provided  -  -  12/10/17  12/10/17  12/10/17   Row Name 03/08/18 1500             Response to Exercise    Blood Pressure (Admit)  152/70       Blood Pressure (Exercise)  132/72  Blood Pressure (Exit)  142/70       Heart Rate (Admit)  78 bpm       Heart Rate (Exercise)  113 bpm       Heart Rate (Exit)  90 bpm       Rating of Perceived Exertion (Exercise)  15       Symptoms  none       Duration  Continue with 30 min of aerobic exercise without signs/symptoms of physical distress.       Intensity  THRR unchanged         Progression   Progression  Continue to progress workloads to maintain intensity without signs/symptoms of physical distress.       Average METs  2.55         Resistance Training   Training Prescription  Yes       Weight  3 lbs       Reps  10-15         Interval Training   Interval Training  No         Treadmill   MPH  1.5       Grade  0.5       Minutes  15       METs  2.25         NuStep   Level  3       Minutes  15       METs  2.4         Biostep-RELP   Level  2       Minutes  15       METs  3         Home Exercise Plan   Plans to continue exercise at  Home (comment) walking and nustep        Frequency  Add 3 additional days to program exercise sessions.       Initial Home Exercises Provided  12/10/17          Exercise Comments: Exercise Comments    Row Name 11/26/17 4287 12/10/17 0827         Exercise Comments  First full day of exercise!  Patient was oriented to gym and equipment including functions, settings, policies, and procedures.  Patient's individual exercise prescription and treatment plan were reviewed.  All starting workloads were established based on the results of the 6 minute walk test done at initial orientation visit.  The plan for exercise progression was also introduced and progression will be customized based on patient's performance and goals.  Reviewed home exercise with pt today.  Pt plans to walk at home, and use his nustep for exercise.  Reviewed THR, pulse, RPE, sign and symptoms, NTG use, and when to call 911 or MD.  Also  discussed weather considerations and indoor options.  Pt voiced understanding.         Exercise Goals and Review: Exercise Goals    Row Name 11/23/17 1430             Exercise Goals   Increase Physical Activity  Yes       Intervention  Provide advice, education, support and counseling about physical activity/exercise needs.;Develop an individualized exercise prescription for aerobic and resistive training based on initial evaluation findings, risk stratification, comorbidities and participant's personal goals.       Expected Outcomes  Short Term: Attend rehab on a regular basis to increase amount of physical activity.;Long Term: Add in home exercise to  make exercise part of routine and to increase amount of physical activity.;Long Term: Exercising regularly at least 3-5 days a week.       Increase Strength and Stamina  Yes       Intervention  Provide advice, education, support and counseling about physical activity/exercise needs.;Develop an individualized exercise prescription for aerobic and resistive training based on initial evaluation findings, risk stratification, comorbidities and participant's personal goals.       Expected Outcomes  Short Term: Increase workloads from initial exercise prescription for resistance, speed, and METs.;Short Term: Perform resistance training exercises routinely during rehab and add in resistance training at home;Long Term: Improve cardiorespiratory fitness, muscular endurance and strength as measured by increased METs and functional capacity (6MWT)       Able to understand and use rate of perceived exertion (RPE) scale  Yes       Intervention  Provide education and explanation on how to use RPE scale       Expected Outcomes  Short Term: Able to use RPE daily in rehab to express subjective intensity level;Long Term:  Able to use RPE to guide intensity level when exercising independently       Knowledge and understanding of Target Heart Rate Range (THRR)  Yes        Intervention  Provide education and explanation of THRR including how the numbers were predicted and where they are located for reference       Expected Outcomes  Short Term: Able to use daily as guideline for intensity in rehab;Long Term: Able to use THRR to govern intensity when exercising independently;Short Term: Able to state/look up THRR       Able to check pulse independently  Yes       Intervention  Review the importance of being able to check your own pulse for safety during independent exercise;Provide education and demonstration on how to check pulse in carotid and radial arteries.       Expected Outcomes  Short Term: Able to explain why pulse checking is important during independent exercise;Long Term: Able to check pulse independently and accurately       Understanding of Exercise Prescription  Yes       Intervention  Provide education, explanation, and written materials on patient's individual exercise prescription       Expected Outcomes  Short Term: Able to explain program exercise prescription;Long Term: Able to explain home exercise prescription to exercise independently          Exercise Goals Re-Evaluation : Exercise Goals Re-Evaluation    Row Name 11/26/17 0811 12/01/17 1456 12/03/17 0830 12/10/17 0828 12/15/17 1418     Exercise Goal Re-Evaluation   Exercise Goals Review  Increase Physical Activity;Increase Strength and Stamina;Able to understand and use rate of perceived exertion (RPE) scale;Knowledge and understanding of Target Heart Rate Range (THRR);Understanding of Exercise Prescription  Increase Physical Activity;Increase Strength and Stamina;Understanding of Exercise Prescription  Increase Physical Activity;Increase Strength and Stamina;Understanding of Exercise Prescription  Increase Physical Activity;Increase Strength and Stamina;Understanding of Exercise Prescription  Increase Physical Activity;Increase Strength and Stamina;Understanding of Exercise Prescription    Comments  Reviewed RPE scale, THR and program prescription with pt today.  Pt voiced understanding and was given a copy of goals to take home.   Todd Barr is off to a good start in rehab.  He has completed his first two days in rehab.  We will continue to monitor his progression.   Todd Barr states that he feels like he is doing  okay so far with exercise. He reports feeling stronger already and is pleased with his progress. He will be having surgery on his left arm September 3, which he doesn't want to let set him back.   Reviewed home exercise with pt today.  Pt plans to walk at home, and use his nustep for exercise.  Reviewed THR, pulse, RPE, sign and symptoms, NTG use, and when to call 911 or MD.  Also discussed weather considerations and indoor options.  Pt voiced understanding.  Todd Barr has been doing well in rehab.  He has moved up on the NuStep to level 3.  We will continue to monitor his progress.    Expected Outcomes  Short: Use RPE daily to regulate intensity. Long: Follow program prescription in THR.  Short: Continue to attend rehab regularly.  Long: Continue to follow program prescription.   Short: continue to attend rehab regularly. Long: become independent on the exercise equipment.   Short: add an additonal 3 days of exercise at home Long: increase overall activity level   Short: Increase workload on treadmill.  Long: Continue to improve strength and stamina.    [place] Name 12/28/17 1623 01/11/18 1520 01/25/18 1552 02/02/18 1201 02/23/18 1320     Exercise Goal Re-Evaluation   Exercise Goals Review  -  -  -  -  Increase Physical Activity;Increase Strength and Stamina;Understanding of Exercise Prescription   Comments  Out since last review  Out since last review  Out since last review  Out since last review  Todd returned today and was able to pick up with his workloads.  We will continue to monitor his progression now that he is back. We will also revisit his home exercise once he gets back into the swing of  things.    Expected Outcomes  -  -  -  -  Short: Attend regularly again.  Long: Work on Education administrator.    [place] Name 03/02/18 0758 03/08/18 1546           Exercise Goal Re-Evaluation   Exercise Goals Review  Increase Physical Activity;Increase Strength and Stamina;Understanding of Exercise Prescription  Increase Physical Activity;Increase Strength and Stamina;Understanding of Exercise Prescription      Comments  Todd Barr is enjoying being back in class. He is using a recumbent bike at home for 20 minutes 2 days per week. Todd Barr was encouraged to add one more day of home exercise and to exercise for 30 minutes.   Todd Barr has been doing well in rehab since returning.  He should be ready to start to increase his workloads again.  We will continue to monitor his progress.       Expected Outcomes  Short: Continue coming to class regularly. Long: Add one more day of home exercise and increase time to 30 minutes. Work on Education administrator.  Short: Begin to increase workloads.  Long: Continue to add in more exercise at home.          Discharge Exercise Prescription (Final Exercise Prescription Changes): Exercise Prescription Changes - 03/08/18 1500      Response to Exercise   Blood Pressure (Admit)  152/70    Blood Pressure (Exercise)  132/72    Blood Pressure (Exit)  142/70    Heart Rate (Admit)  78 bpm    Heart Rate (Exercise)  113 bpm    Heart Rate (Exit)  90 bpm    Rating of Perceived Exertion (Exercise)  15    Symptoms  none    Duration  Continue with 30 min of aerobic exercise without signs/symptoms of physical distress.    Intensity  THRR unchanged      Progression   Progression  Continue to progress workloads to maintain intensity without signs/symptoms of physical distress.    Average METs  2.55      Resistance Training   Training Prescription  Yes    Weight  3 lbs    Reps  10-15      Interval Training   Interval Training  No      Treadmill   MPH  1.5     Grade  0.5    Minutes  15    METs  2.25      NuStep   Level  3    Minutes  15    METs  2.4      Biostep-RELP   Level  2    Minutes  15    METs  3      Home Exercise Plan   Plans to continue exercise at  Home (comment)   walking and nustep    Frequency  Add 3 additional days to program exercise sessions.    Initial Home Exercises Provided  12/10/17       Nutrition:  Target Goals: Understanding of nutrition guidelines, daily intake of sodium '1500mg'$ , cholesterol '200mg'$ , calories 30% from fat and 7% or less from saturated fats, daily to have 5 or more servings of fruits and vegetables.  Biometrics: Pre Biometrics - 11/23/17 1431      Pre Biometrics   Height  5' 9.4" (1.763 m)    Weight  205 lb 4.8 oz (93.1 kg)    Waist Circumference  42.5 inches    Hip Circumference  41.5 inches    Waist to Hip Ratio  1.02 %    BMI (Calculated)  29.96    Single Leg Stand  14.05 seconds        Nutrition Therapy Plan and Nutrition Goals: Nutrition Therapy & Goals - 11/26/17 0843      Nutrition Therapy   Diet  DM    Drug/Food Interactions  Statins/Certain Fruits    Protein (specify units)  12oz    Fiber  30 grams    Whole Grain Foods  3 servings   eats some whole grains, likes sourdough bread   Saturated Fats  15 max. grams    Fruits and Vegetables  5 servings/day   8 ideal, eats 2 meals/day typically   Sodium  1500 grams      Personal Nutrition Goals   Nutrition Goal  Become more familiar with foods high in potassium and phosphorus. Limit or avoid these foods d/t dialysis treatments   handouts provided which listed foods high in potassium and phosphorus   Personal Goal #2  Eat on a consistent schedule each day, trying not to skip meals or go long periods of time without eating in order to best manage BG levels. Keeping your new schedule of adding breakfast daily would be ideal    Personal Goal #3  Continue to limit sodium intake, paying particular attention to canned items and  pre-prepared meals. Upper limit range for sodium on a frozen/ pre-prepared meal is 500-'600mg'$  per meal    Comments  He monitors sodium and total fluid intake d/t dialysis treatments. He also avoids some foods with potassium and phosphorus, in addition to being on medications which limit absorption. He does not follow a diabetic diet but  does try to limit his sugar intake and keep carbs "lower"      Intervention Plan   Intervention  Nutrition handout(s) given to patient.;Prescribe, educate and counsel regarding individualized specific dietary modifications aiming towards targeted core components such as weight, hypertension, lipid management, diabetes, heart failure and other comorbidities.   Potssium & sodium content of foods, Controlling your phosphorus    Expected Outcomes  Short Term Goal: Understand basic principles of dietary content, such as calories, fat, sodium, cholesterol and nutrients.;Short Term Goal: A plan has been developed with personal nutrition goals set during dietitian appointment.;Long Term Goal: Adherence to prescribed nutrition plan.       Nutrition Assessments: Nutrition Assessments - 11/23/17 1254      MEDFICTS Scores   Pre Score  52       Nutrition Goals Re-Evaluation: Nutrition Goals Re-Evaluation    Row Name 11/26/17 0909 12/03/17 0836 02/23/18 0843         Goals   Current Weight  -  207 lb (93.9 kg)  -     Nutrition Goal  Become more familiar with foods high in potassium and phosphorus. Limit or avoid these foods d/t dialysis treatments  Still working on becoming familiar with food that contain potassium.   Become more familar with foods high in potassium and phosphorus; eat on a consistent schedule each day; continue to limit sodium intake     Comment  He is on dialysis and monitors potassium and phosphorus in foods to a certain extent but could use a more in-depth review of foods and beverages that contain these two micronutrients  Todd Barr says he's been watchig  out for foods that are high in potassium for some time now -- says his bllod levels were great the last time he had dialysis.   He states that per dialysis clinic his phosphorus and potassium labs have both been within range. He has been instructed to limit fluids slightly. He has been eating on a consistent schedule and is not skipping meals. He is regaining the 15-20# he lost during previous hospital stay though he would prefer not to gain all the weight back and maintain at a lower body weight     Expected Outcome  He will avoid most foods high in potassium and phosphorus, and limit foods moderate in these micronutrients  Short: become more aware of foods high in potassium Long: avoid foods high in potassium   He will pay close attention to phosphorus and potassium lab values, limit fluids and sodium as instructed, and continue to eat on a consistent schedule as important for dialysis and to manage BG levels       Personal Goal #2 Re-Evaluation   Personal Goal #2  Eat on a consistent schedule each day, trying not to skip meals or go long periods of time without eating to better manage BG levels. Keeping your new schedule of adding breakfast daily would be ideal  -  -       Personal Goal #3 Re-Evaluation   Personal Goal #3  Continue to limit sodium intake- paying particular attention to canned items and pre-prepared meals. Upper limit range for sodium on a frozen/ pre-pepared meal is 500-'600mg'$  per meal  -  -        Nutrition Goals Discharge (Final Nutrition Goals Re-Evaluation): Nutrition Goals Re-Evaluation - 02/23/18 0843      Goals   Nutrition Goal  Become more familar with foods high in potassium and phosphorus; eat on a  consistent schedule each day; continue to limit sodium intake    Comment  He states that per dialysis clinic his phosphorus and potassium labs have both been within range. He has been instructed to limit fluids slightly. He has been eating on a consistent schedule and is not  skipping meals. He is regaining the 15-20# he lost during previous hospital stay though he would prefer not to gain all the weight back and maintain at a lower body weight    Expected Outcome  He will pay close attention to phosphorus and potassium lab values, limit fluids and sodium as instructed, and continue to eat on a consistent schedule as important for dialysis and to manage BG levels       Psychosocial: Target Goals: Acknowledge presence or absence of significant depression and/or stress, maximize coping skills, provide positive support system. Participant is able to verbalize types and ability to use techniques and skills needed for reducing stress and depression.   Initial Review & Psychosocial Screening: Initial Psych Review & Screening - 11/23/17 1252      Initial Review   Current issues with  Current Stress Concerns    Source of Stress Concerns  Chronic Illness;Unable to perform yard/household activities    Comments  Todd Barr has just been placed on Dialysis (T, Th, Sa) this past month. He is supposed to go for a fistula in October. This has really been hard. He reports a lot of his issues he does not know if it is coming from his recent heart attack or his failing kindeys.       Family Dynamics   Good Support System?  Yes   spouse     Screening Interventions   Interventions  Encouraged to exercise;Program counselor consult;To provide support and resources with identified psychosocial needs;Provide feedback about the scores to participant    Expected Outcomes  Short Term goal: Utilizing psychosocial counselor, staff and physician to assist with identification of specific Stressors or current issues interfering with healing process. Setting desired goal for each stressor or current issue identified.;Long Term Goal: Stressors or current issues are controlled or eliminated.;Short Term goal: Identification and review with participant of any Quality of Life or Depression concerns found  by scoring the questionnaire.;Long Term goal: The participant improves quality of Life and PHQ9 Scores as seen by post scores and/or verbalization of changes       Quality of Life Scores:  Quality of Life - 11/23/17 1253      Quality of Life   Select  Quality of Life      Quality of Life Scores   Health/Function Pre  19.33 %    Socioeconomic Pre  20.63 %    Psych/Spiritual Pre  24.29 %    Family Pre  30 %    GLOBAL Pre  22.14 %      Scores of 19 and below usually indicate a poorer quality of life in these areas.  A difference of  2-3 points is a clinically meaningful difference.  A difference of 2-3 points in the total score of the Quality of Life Index has been associated with significant improvement in overall quality of life, self-image, physical symptoms, and general health in studies assessing change in quality of life.  PHQ-9: Recent Review Flowsheet Data    Depression screen [facility] 2/9 11/23/2017   Decreased Interest 0   Down, Depressed, Hopeless 0   PHQ - 2 Score 0   Altered sleeping 0   Tired, decreased energy 2  Change in appetite 0   Feeling bad or failure about yourself  0   Trouble concentrating 0   Moving slowly or fidgety/restless 0   Suicidal thoughts 0   PHQ-9 Score 2   Difficult doing work/chores Somewhat difficult     Interpretation of Total Score  Total Score Depression Severity:  1-4 = Minimal depression, 5-9 = Mild depression, 10-14 = Moderate depression, 15-19 = Moderately severe depression, 20-27 = Severe depression   Psychosocial Evaluation and Intervention: Psychosocial Evaluation - 12/10/17 1002      Psychosocial Evaluation & Interventions   Interventions  Encouraged to exercise with the program and follow exercise prescription    Comments  Counselor met with Mr. Barr today for initial psychosocial evaluation.  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   Todd Barr has a strong support system with a spouse of 58 years; a son in [place] and active involvement in his local church community.  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  Todd sleeps well with a CPAP and has a good appetite.  He denies a history of depression or anxiety or any current symptoms and states he is typically in a positive mood.  Todd's health is reportedly his primary stressor at this time.  He has goals to increase his stamina while in this program.  Staff will follow with him.    Expected Outcomes  Short:  Todd Barr will exercise consistently to increase his stamina and for his mental health as a positive coping strategy for stress.   Long:  Todd Barr will develop a positive and healthy lifestyle routine of exercise and diet and stress management..    Continue Psychosocial Services   Follow up required by staff       Psychosocial Re-Evaluation: Psychosocial Re-Evaluation    [place] Name 12/03/17 [phone] 03/02/18 0804           Psychosocial Re-Evaluation   Current issues with  None Identified  Current Stress Concerns      Comments  Todd Barr is in good spirits. He has no trouble sleeping, he uses a CPAP and has an inclined bed that helps.   Todd reports being happy and sleeping well. He sleeps with his CPAP and jokes that his sleep is only interrupted when he has to come to Cardiac Rehab early. Todd's upcoming procedure on 03/09/2018 is a stressor. He is hopeful to have home dyalisis so he sees this procedure as a positive opportunity. He will hear results on 03/18/2018 and how his fistula is healing.      Expected Outcomes  Short: continue to be in good spirits no matter what comes his way Long: stay stress free    Short: Although he has health concerns, he has a great outlook, continue with positive attitude. Long: Happy where he is, just keep it up.         Psychosocial Discharge (Final Psychosocial Re-Evaluation): Psychosocial Re-Evaluation - 03/02/18 0804      Psychosocial  Re-Evaluation   Current issues with  Current Stress Concerns    Comments  Todd Barr reports being happy and sleeping well. He sleeps with his CPAP and jokes that his sleep is only interrupted when he has to come to Cardiac Rehab early. Todd's upcoming procedure on 03/09/2018 is a stressor. He is hopeful to have home dyalisis so he sees this procedure as a  positive opportunity. He will hear results on 03/18/2018 and how his fistula is healing.    Expected Outcomes  Short: Although he has health concerns, he has a great outlook, continue with positive attitude. Long: Happy where he is, just keep it up.       Vocational Rehabilitation: Provide vocational rehab assistance to qualifying candidates.   Vocational Rehab Evaluation & Intervention: Vocational Rehab - 11/23/17 1255      Initial Vocational Rehab Evaluation & Intervention   Assessment shows need for Vocational Rehabilitation  No       Education: Education Goals: Education classes will be provided on a variety of topics geared toward better understanding of heart health and risk factor modification. Participant will state understanding/return demonstration of topics presented as noted by education test scores.  Learning Barriers/Preferences: Learning Barriers/Preferences - 11/23/17 1254      Learning Barriers/Preferences   Learning Barriers  None    Learning Preferences  Individual Instruction;Written Material       Education Topics:  AED/CPR: - Group verbal and written instruction with the use of models to demonstrate the basic use of the AED with the basic ABC's of resuscitation.   General Nutrition Guidelines/Fats and Fiber: -Group instruction provided by verbal, written material, models and posters to present the general guidelines for heart healthy nutrition. Gives an explanation and review of dietary fats and fiber.   Controlling Sodium/Reading Food Labels: -Group verbal and written material supporting the discussion of  sodium use in heart healthy nutrition. Review and explanation with models, verbal and written materials for utilization of the food label.   Exercise Physiology & General Exercise Guidelines: - Group verbal and written instruction with models to review the exercise physiology of the cardiovascular system and associated critical values. Provides general exercise guidelines with specific guidelines to those with heart or lung disease.    Cardiac Rehab from 03/08/2018 in [facility] Cardiac and Pulmonary Rehab  Date  03/02/18  Educator  [facility]  Instruction Review Code  1- Verbalizes Understanding      Aerobic Exercise & Resistance Training: - Gives group verbal and written instruction on the various components of exercise. Focuses on aerobic and resistive training programs and the benefits of this training and how to safely progress through these programs..   Cardiac Rehab from 03/08/2018 in [facility] Cardiac and Pulmonary Rehab  Date  03/04/18  Educator  [facility]  Instruction Review Code  1- Verbalizes Understanding      Flexibility, Balance, Mind/Body Relaxation: Provides group verbal/written instruction on the benefits of flexibility and balance training, including mind/body exercise modes such as yoga, pilates and tai chi.  Demonstration and skill practice provided.   Cardiac Rehab from 03/08/2018 in [facility] Cardiac and Pulmonary Rehab  Date  03/08/18  Educator  [facility]  Instruction Review Code  1- Verbalizes Understanding      Stress and Anxiety: - Provides group verbal and written instruction about the health risks of elevated stress and causes of high stress.  Discuss the correlation between heart/lung disease and anxiety and treatment options. Review healthy ways to manage with stress and anxiety.   Cardiac Rehab from 03/08/2018 in [facility] Cardiac and Pulmonary Rehab  Date  12/01/17  Educator  [facility]  Instruction Review Code  1- Verbalizes Understanding      Depression: - Provides group verbal and written  instruction on the correlation between heart/lung disease and depressed mood, treatment options, and the stigmas associated with seeking treatment.   Anatomy & Physiology of the Heart: -  Group verbal and written instruction and models provide basic cardiac anatomy and physiology, with the coronary electrical and arterial systems. Review of Valvular disease and Heart Failure   Cardiac Rehab from 03/08/2018 in [facility] Cardiac and Pulmonary Rehab  Date  12/03/17  Educator  CE  Instruction Review Code  1- Verbalizes Understanding      Cardiac Procedures: - Group verbal and written instruction to review commonly prescribed medications for heart disease. Reviews the medication, class of the drug, and side effects. Includes the steps to properly store meds and maintain the prescription regimen. (beta blockers and nitrates)   Cardiac Medications I: - Group verbal and written instruction to review commonly prescribed medications for heart disease. Reviews the medication, class of the drug, and side effects. Includes the steps to properly store meds and maintain the prescription regimen.   Cardiac Medications II: -Group verbal and written instruction to review commonly prescribed medications for heart disease. Reviews the medication, class of the drug, and side effects. (all other drug classes)   Cardiac Rehab from 03/08/2018 in [facility] Cardiac and Pulmonary Rehab  Date  11/26/17  Educator  CE  Instruction Review Code  1- Verbalizes Understanding       Go Sex-Intimacy & Heart Disease, Get SMART - Goal Setting: - Group verbal and written instruction through game format to discuss heart disease and the return to sexual intimacy. Provides group verbal and written material to discuss and apply goal setting through the application of the S.M.A.R.T. Method.   Other Matters of the Heart: - Provides group verbal, written materials and models to describe Stable Angina and Peripheral Artery. Includes  description of the disease process and treatment options available to the cardiac patient.   Cardiac Rehab from 03/08/2018 in [facility] Cardiac and Pulmonary Rehab  Date  12/03/17  Educator  CE  Instruction Review Code  1- Verbalizes Understanding      Exercise & Equipment Safety: - Individual verbal instruction and demonstration of equipment use and safety with use of the equipment.   Cardiac Rehab from 03/08/2018 in [facility] Cardiac and Pulmonary Rehab  Date  11/23/17  Educator  [facility]  Instruction Review Code  1- Verbalizes Understanding      Infection Prevention: - Provides verbal and written material to individual with discussion of infection control including proper hand washing and proper equipment cleaning during exercise session.   Cardiac Rehab from 03/08/2018 in [facility] Cardiac and Pulmonary Rehab  Date  11/23/17  Educator  [facility]  Instruction Review Code  1- Verbalizes Understanding      Falls Prevention: - Provides verbal and written material to individual with discussion of falls prevention and safety.   Cardiac Rehab from 03/08/2018 in [facility] Cardiac and Pulmonary Rehab  Date  11/23/17  Educator  [facility]  Instruction Review Code  1- Verbalizes Understanding      Diabetes: - Individual verbal and written instruction to review signs/symptoms of diabetes, desired ranges of glucose level fasting, after meals and with exercise. Acknowledge that pre and post exercise glucose checks will be done for 3 sessions at entry of program.   Cardiac Rehab from 03/08/2018 in [facility] Cardiac and Pulmonary Rehab  Date  11/23/17  Educator  [facility]  Instruction Review Code  1- Verbalizes Understanding      Know Your Numbers and Risk Factors: -Group verbal and written instruction about important numbers in your health.  Discussion of what are risk factors and how they play a role in the disease process.  Review of  Cholesterol, Blood Pressure, Diabetes, and BMI and the role they play in your overall health.    Cardiac Rehab from 03/08/2018 in [facility] Cardiac and Pulmonary Rehab  Date  11/26/17  Educator  CE  Instruction Review Code  1- Verbalizes Understanding      Sleep Hygiene: -Provides group verbal and written instruction about how sleep can affect your health.  Define sleep hygiene, discuss sleep cycles and impact of sleep habits. Review good sleep hygiene tips.    Cardiac Rehab from 03/08/2018 in [facility] Cardiac and Pulmonary Rehab  Date  02/23/18  Educator  [facility]  Instruction Review Code  1- Verbalizes Understanding      Other: -Provides group and verbal instruction on various topics (see comments)   Knowledge Questionnaire Score: Knowledge Questionnaire Score - 11/23/17 1254      Knowledge Questionnaire Score   Pre Score  25/26   correct answers reviewed with patient, focus on exercise      Core Components/Risk Factors/Patient Goals at Admission: Personal Goals and Risk Factors at Admission - 11/23/17 1255      Core Components/Risk Factors/Patient Goals on Admission    Weight Management  Yes;Weight Loss    Intervention  Weight Management: Develop a combined nutrition and exercise program designed to reach desired caloric intake, while maintaining appropriate intake of nutrient and fiber, sodium and fats, and appropriate energy expenditure required for the weight goal.;Weight Management: Provide education and appropriate resources to help participant work on and attain dietary goals.;Weight Management/Obesity: Establish reasonable short term and long term weight goals.    Admit Weight  205 lb (93 kg)    Goal Weight: Short Term  200 lb (90.7 kg)    Goal Weight: Long Term  200 lb (90.7 kg)    Expected Outcomes  Short Term: Continue to assess and modify interventions until short term weight is achieved;Long Term: Adherence to nutrition and physical activity/exercise program aimed toward attainment of established weight goal;Weight Loss: Understanding of general recommendations for a  balanced deficit meal plan, which promotes 1-2 lb weight loss per week and includes a negative energy balance of [phone] kcal/d;Understanding recommendations for meals to include 15-35% energy as protein, 25-35% energy from fat, 35-60% energy from carbohydrates, less than '200mg'$  of dietary cholesterol, 20-35 gm of total fiber daily;Understanding of distribution of calorie intake throughout the day with the consumption of 4-5 meals/snacks    Diabetes  Yes    Intervention  Provide education about signs/symptoms and action to take for hypo/hyperglycemia.;Provide education about proper nutrition, including hydration, and aerobic/resistive exercise prescription along with prescribed medications to achieve blood glucose in normal ranges: Fasting glucose 65-99 mg/dL    Expected Outcomes  Short Term: Participant verbalizes understanding of the signs/symptoms and immediate care of hyper/hypoglycemia, proper foot care and importance of medication, aerobic/resistive exercise and nutrition plan for blood glucose control.;Long Term: Attainment of HbA1C < 7%.    Hypertension  Yes    Intervention  Monitor prescription use compliance.;Provide education on lifestyle modifcations including regular physical activity/exercise, weight management, moderate sodium restriction and increased consumption of fresh fruit, vegetables, and low fat dairy, alcohol moderation, and smoking cessation.    Expected Outcomes  Short Term: Continued assessment and intervention until BP is < 140/65m HG in hypertensive participants. < 130/821mHG in hypertensive participants with diabetes, heart failure or chronic kidney disease.;Long Term: Maintenance of blood pressure at goal levels.    Lipids  Yes    Intervention  Provide education and support for participant on nutrition &  aerobic/resistive exercise along with prescribed medications to achieve LDL '70mg'$ , HDL >'40mg'$ .    Expected Outcomes  Short Term: Participant states understanding of desired  cholesterol values and is compliant with medications prescribed. Participant is following exercise prescription and nutrition guidelines.;Long Term: Cholesterol controlled with medications as prescribed, with individualized exercise RX and with personalized nutrition plan. Value goals: LDL < '70mg'$ , HDL > 40 mg.       Core Components/Risk Factors/Patient Goals Review:  Goals and Risk Factor Review    Row Name 12/03/17 6063 03/02/18 0807           Core Components/Risk Factors/Patient Goals Review   Personal Goals Review  Weight Management/Obesity;Diabetes;Hypertension  Weight Management/Obesity;Diabetes;Hypertension      Review  Todd Barr takes his medications as prescribed and is keeping his diabetes under control. 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 He would like to lose a few pounds to feel better.   Todd Barr is taking his meds as directed and has an upcoming procedure to consider home dialysis which would require a different port (mode). He would like to lose a few pounds but is also happy where he is. Blood glucose 140-'180mg'$ .dL one hour after eating. He is on a sliding scale with insulin so makes adjustments when needed. Blood pressure at home 151/64 before last med change. Dialysis drops blood pressure about 20 to 131/64. Doctor has changed time of day to take blood pressure meds. Monitoring and doctor will adjust meds as needed.       Expected Outcomes  Short: continue to take medications as directed. Long: lose 5 pounds through diet and exercise   Short: Continue to take meds as directed, monitor blood glucose, home blood pressure readings, and use CPAP. Long: lose 5 pounds through diet and exercise.         Core Components/Risk Factors/Patient Goals at Discharge (Final Review):  Goals and Risk Factor Review - 03/02/18 0807      Core Components/Risk Factors/Patient Goals Review   Personal Goals Review  Weight Management/Obesity;Diabetes;Hypertension    Review   Todd Barr is taking his meds as directed and has an upcoming procedure to consider home dialysis which would require a different port (mode). He would like to lose a few pounds but is also happy where he is. Blood glucose 140-'180mg'$ .dL one hour after eating. He is on a sliding scale with insulin so makes adjustments when needed. Blood pressure at home 151/64 before last med change. Dialysis drops blood pressure about 20 to 131/64. Doctor has changed time of day to take blood pressure meds. Monitoring and doctor will adjust meds as needed.     Expected Outcomes  Short: Continue to take meds as directed, monitor blood glucose, home blood pressure readings, and use CPAP. Long: lose 5 pounds through diet and exercise.       ITP Comments: ITP Comments    Row Name 11/23/17 1216 11/25/17 0817 12/10/17 0818 12/22/17 1531 12/23/17 0616   ITP Comments  Med Review completed. Initial ITP created. Diagnosis can be found in Media Tab from [place] 7/9  30 day review completed. ITP sent to Dr. Ramonita Lab, covering for Dr. Emily Filbert, Medical Director of Cardiac Rehab. Continue with ITP unless changes are made by physician.  New to program.  Has not started to exercise yet.   Todd let us know today that he will be having a fistula placed in his left arm next Tuesday. We talked about making sure he gets clearance  before returning to rehab.  Spoke with wife.  Todd had his fistula placed last week.  He has his follow scheduled for next week.  Reminded her that we will need clearance for him to return.   30 day review completed. ITP sent to Dr. Emily Filbert, Medical Director of Cardiac Rehab. Continue with ITP unless changes are made by physician  Out for medical reason   [place] Name 01/11/18 1520 01/20/18 0750 01/25/18 1359 02/02/18 1201 02/17/18 0549   ITP Comments  Called to check on patient.  He has a follow up appointment from his fistula placement this Wednesday.  He will need clearance to return to rehab.  He is hoping to be able to  come back either this Thursday or next Tuesday.   30 day review completed. ITP sent to Dr. Emily Filbert, Medical Director of Cardiac Rehab. Continue with ITP unless changes are made by physician [place] for medical reason  Called to check on Todd.  Out since 8/29 for fistula placement.  Was supposed to return last week.  Unable to leave message.  Will give until end of week, then will send letter to discharge.   Called to check on [facility]. [facility] to be cleared to return. Spoke with his wife. He has the appointment date in his phone and will let us know when that is.  He will returned once cleared.   30 day review. Continue with ITP unless direccted changes per Medical Director Chart Review.   [place] Name 02/18/18 1315 02/23/18 0815 03/17/18 0553       ITP Comments  Todd called to let us know that he was finally cleared to return to rehab.  He has a letter to bring in clearing him.  He hopes to return on Tuesday 11/12.  Todd returned today for first time since fistula was placed.  He did bring back a note releasing him and it was placed in his chart.    30 day review. Continue with ITP unless direccted changes per Medical Director Chart Review.        Comments:

## 2018-03-23 ENCOUNTER — Telehealth: Payer: Self-pay | Admitting: *Deleted

## 2018-03-23 ENCOUNTER — Encounter: Payer: Self-pay | Admitting: *Deleted

## 2018-03-23 DIAGNOSIS — Z955 Presence of coronary angioplasty implant and graft: Secondary | ICD-10-CM

## 2018-03-23 DIAGNOSIS — I214 Non-ST elevation (NSTEMI) myocardial infarction: Secondary | ICD-10-CM

## 2018-03-23 NOTE — Telephone Encounter (Signed)
Called to check on Todd Barr and spoke with his wife.  He has another procedure done on his fistula.  He is also getting an abdominal line placed on 03/29/18.  He will need clearance to return again.  Reminded wife that his current order is only good through 04/28/18.

## 2018-04-05 ENCOUNTER — Encounter: Payer: Self-pay | Admitting: *Deleted

## 2018-04-05 DIAGNOSIS — Z955 Presence of coronary angioplasty implant and graft: Secondary | ICD-10-CM

## 2018-04-05 DIAGNOSIS — I214 Non-ST elevation (NSTEMI) myocardial infarction: Secondary | ICD-10-CM

## 2018-04-13 ENCOUNTER — Encounter: Payer: Self-pay | Admitting: *Deleted

## 2018-04-13 DIAGNOSIS — I214 Non-ST elevation (NSTEMI) myocardial infarction: Secondary | ICD-10-CM

## 2018-04-13 DIAGNOSIS — Z955 Presence of coronary angioplasty implant and graft: Secondary | ICD-10-CM

## 2018-04-13 NOTE — Progress Notes (Signed)
Cardiac Individual Treatment Plan  Patient Details  Name: Todd Barr MRN: [id number] Date of Birth: 24-Jan-1946 Referring Provider:     Cardiac Rehab from 11/23/2017 in [facility] Cardiac and Pulmonary Rehab  Referring Provider  Barbie Haggis MD ([place])      Initial Encounter Date:    Cardiac Rehab from 11/23/2017 in [facility] Cardiac and Pulmonary Rehab  Date  11/23/17      Visit Diagnosis: NSTEMI (non-ST elevated myocardial infarction) [facility])  Status post coronary artery stent placement  Patient's Home Medications on Admission:  Current Outpatient Medications:  .  amLODipine (NORVASC) 10 MG tablet, Take 10 mg by mouth daily., Disp: , Rfl:  .  aspirin 81 MG chewable tablet, Chew by mouth., Disp: , Rfl:  .  atorvastatin (LIPITOR) 80 MG tablet, Take 80 mg by mouth at bedtime., Disp: , Rfl:  .  calcitRIOL (ROCALTROL) 0.25 MCG capsule, Take 0.25 mcg by mouth daily., Disp: , Rfl:  .  calcium acetate (PHOSLO) 667 MG capsule, Take 1,334 mg by mouth 2 (two) times daily with a meal., Disp: , Rfl:  .  Cholecalciferol 1000 units tablet, Take 1,000 Units by mouth daily., Disp: , Rfl:  .  clopidogrel (PLAVIX) 75 MG tablet, Take 75 mg by mouth daily., Disp: , Rfl:  .  ferrous sulfate 325 (65 FE) MG EC tablet, Take 325 mg by mouth 3 (three) times daily with meals., Disp: , Rfl:  .  glucose blood test strip, 1 each by Other route as needed for other. Use as instructed, Disp: , Rfl:  .  hydrALAZINE (APRESOLINE) 50 MG tablet, Take 50 mg by mouth 3 (three) times daily., Disp: , Rfl:  .  insulin glargine (LANTUS) 100 UNIT/ML injection, Inject 25 Units into the skin 2 (two) times daily., Disp: , Rfl:  .  insulin regular (NOVOLIN R,HUMULIN R) 100 units/mL injection, Inject 15 Units into the skin 2 (two) times daily before a meal., Disp: , Rfl:  .  isosorbide mononitrate (IMDUR) 60 MG 24 hr tablet, Take 60 mg by mouth every morning., Disp: , Rfl:  .  magnesium oxide (MAG-OX) 400 MG tablet, Take 400 mg by mouth daily.,  Disp: , Rfl:  .  metoprolol (TOPROL-XL) 200 MG 24 hr tablet, Take 100 mg by mouth daily., Disp: , Rfl:  .  nitroGLYCERIN (NITROSTAT) 0.4 MG SL tablet, Place 0.4 mg under the tongue every 5 (five) minutes as needed for chest pain., Disp: , Rfl:  .  omeprazole (PRILOSEC) 20 MG capsule, Take 20 mg by mouth every morning., Disp: , Rfl:  .  ranitidine (ZANTAC) 150 MG tablet, Take 150 mg by mouth 2 (two) times daily as needed for heartburn., Disp: , Rfl:  .  simethicone (MYLICON) 80 MG chewable tablet, Chew 80 mg by mouth 3 (three) times daily as needed for flatulence., Disp: , Rfl:  .  sodium bicarbonate 650 MG tablet, Take 650 mg by mouth 2 (two) times daily., Disp: , Rfl:   Past Medical History: No past medical history on file.  Tobacco Use: Social History   Tobacco Use  Smoking Status Never Smoker  Smokeless Tobacco Never Used    Labs: Recent Review Flowsheet Data    There is no flowsheet data to display.       Exercise Target Goals: Exercise Program Goal: Individual exercise prescription set using results from initial 6 min walk test and THRR while considering  patient's activity barriers and safety.   Exercise Prescription Goal: Initial exercise prescription builds to  30-45 minutes a day of aerobic activity, 2-3 days per week.  Home exercise guidelines will be given to patient during program as part of exercise prescription that the participant will acknowledge.  Activity Barriers & Risk Stratification: Activity Barriers & Cardiac Risk Stratification - 11/23/17 1251      Activity Barriers & Cardiac Risk Stratification   Activity Barriers  Arthritis;Neck/Spine Problems;Deconditioning;Muscular Weakness    Cardiac Risk Stratification  High       6 Minute Walk: 6 Minute Walk    Row Name 11/23/17 1427         6 Minute Walk   Phase  Initial     Distance  1115 feet     Walk Time  6 minutes     # of Rest Breaks  0     MPH  2.11     METS  2.89     RPE  12     VO2 Peak   10.09     Symptoms  No     Resting HR  75 bpm     Resting BP  146/60     Resting Oxygen Saturation   99 %     Exercise Oxygen Saturation  during 6 min walk  97 %     Max Ex. HR  119 bpm     Max Ex. BP  158/84     2 Minute Post BP  134/64        Oxygen Initial Assessment:   Oxygen Re-Evaluation:   Oxygen Discharge (Final Oxygen Re-Evaluation):   Initial Exercise Prescription: Initial Exercise Prescription - 11/23/17 1400      Date of Initial Exercise RX and Referring Provider   Date  11/23/17    Referring Provider  Barbie Haggis MD ([place])      Treadmill   MPH  2    Grade  0.5    Minutes  15    METs  2.67      NuStep   Level  2    SPM  80    Minutes  15    METs  2      Biostep-RELP   Level  2    SPM  50    Minutes  15    METs  2      Prescription Details   Frequency (times per week)  2    Duration  Progress to 30 minutes of continuous aerobic without signs/symptoms of physical distress      Intensity   THRR 40-80% of Max Heartrate  105-135    Ratings of Perceived Exertion  11-13    Perceived Dyspnea  0-4      Progression   Progression  Continue to progress workloads to maintain intensity without signs/symptoms of physical distress.      Resistance Training   Training Prescription  Yes    Weight  3 lbs    Reps  10-15       Perform Capillary Blood Glucose checks as needed.  Exercise Prescription Changes: Exercise Prescription Changes    Row Name 11/23/17 1300 12/01/17 1400 12/10/17 0800 12/15/17 1400 02/23/18 1300     Response to Exercise   Blood Pressure (Admit)  146/60  140/66  -  155/60  160/64   Blood Pressure (Exercise)  158/84  154/66  -  166/68  170/80   Blood Pressure (Exit)  134/64  144/50  -  154/58  150/70   Heart Rate (Admit)  75 bpm  99  bpm  -  81 bpm  89 bpm   Heart Rate (Exercise)  119 bpm  121 bpm  -  115 bpm  109 bpm   Heart Rate (Exit)  81 bpm  94 bpm  -  86 bpm  96 bpm   Oxygen Saturation (Admit)  99 %  -  -  -  -   Oxygen  Saturation (Exercise)  97 %  -  -  -  -   Rating of Perceived Exertion (Exercise)  12  13  -  13  13   Symptoms  none  none  -  none  none   Comments  walk test results  second full day of exercise  -  -  -   Duration  -  Continue with 30 min of aerobic exercise without signs/symptoms of physical distress.  -  Continue with 30 min of aerobic exercise without signs/symptoms of physical distress.  Continue with 30 min of aerobic exercise without signs/symptoms of physical distress.   Intensity  -  THRR unchanged  -  THRR unchanged  THRR unchanged     Progression   Progression  -  Continue to progress workloads to maintain intensity without signs/symptoms of physical distress.  -  Continue to progress workloads to maintain intensity without signs/symptoms of physical distress.  Continue to progress workloads to maintain intensity without signs/symptoms of physical distress.   Average METs  -  2.18  -  2.38  2.15     Resistance Training   Training Prescription  -  Yes  -  Yes  Yes   Weight  -  3 lbs  -  4 lbs  3 lbs   Reps  -  10-15  -  10-15  10-15     Interval Training   Interval Training  -  No  -  No  No     Treadmill   MPH  -  1.5  -  1.5  -   Grade  -  0.5  -  0.5  -   Minutes  -  15  -  15  -   METs  -  2.25  -  2.25  -     NuStep   Level  -  2  -  3  3   Minutes  -  15  -  15  15   METs  -  2.3  -  2.9  2.3     Biostep-RELP   Level  -  2  -  2  2   Minutes  -  15  -  15  15   METs  -  2  -  2  2     Home Exercise Plan   Plans to continue exercise at  -  -  Home (comment) walking and nustep   Home (comment) walking and nustep   Home (comment) walking and nustep    Frequency  -  -  Add 3 additional days to program exercise sessions.  Add 3 additional days to program exercise sessions.  Add 3 additional days to program exercise sessions.   Initial Home Exercises Provided  -  -  12/10/17  12/10/17  12/10/17   Row Name 03/08/18 1500             Response to Exercise    Blood Pressure (Admit)  152/70       Blood Pressure (Exercise)  132/72  Blood Pressure (Exit)  142/70       Heart Rate (Admit)  78 bpm       Heart Rate (Exercise)  113 bpm       Heart Rate (Exit)  90 bpm       Rating of Perceived Exertion (Exercise)  15       Symptoms  none       Duration  Continue with 30 min of aerobic exercise without signs/symptoms of physical distress.       Intensity  THRR unchanged         Progression   Progression  Continue to progress workloads to maintain intensity without signs/symptoms of physical distress.       Average METs  2.55         Resistance Training   Training Prescription  Yes       Weight  3 lbs       Reps  10-15         Interval Training   Interval Training  No         Treadmill   MPH  1.5       Grade  0.5       Minutes  15       METs  2.25         NuStep   Level  3       Minutes  15       METs  2.4         Biostep-RELP   Level  2       Minutes  15       METs  3         Home Exercise Plan   Plans to continue exercise at  Home (comment) walking and nustep        Frequency  Add 3 additional days to program exercise sessions.       Initial Home Exercises Provided  12/10/17          Exercise Comments: Exercise Comments    Row Name 11/26/17 4287 12/10/17 0827         Exercise Comments  First full day of exercise!  Patient was oriented to gym and equipment including functions, settings, policies, and procedures.  Patient's individual exercise prescription and treatment plan were reviewed.  All starting workloads were established based on the results of the 6 minute walk test done at initial orientation visit.  The plan for exercise progression was also introduced and progression will be customized based on patient's performance and goals.  Reviewed home exercise with pt today.  Pt plans to walk at home, and use his nustep for exercise.  Reviewed THR, pulse, RPE, sign and symptoms, NTG use, and when to call 911 or MD.  Also  discussed weather considerations and indoor options.  Pt voiced understanding.         Exercise Goals and Review: Exercise Goals    Row Name 11/23/17 1430             Exercise Goals   Increase Physical Activity  Yes       Intervention  Provide advice, education, support and counseling about physical activity/exercise needs.;Develop an individualized exercise prescription for aerobic and resistive training based on initial evaluation findings, risk stratification, comorbidities and participant's personal goals.       Expected Outcomes  Short Term: Attend rehab on a regular basis to increase amount of physical activity.;Long Term: Add in home exercise to  make exercise part of routine and to increase amount of physical activity.;Long Term: Exercising regularly at least 3-5 days a week.       Increase Strength and Stamina  Yes       Intervention  Provide advice, education, support and counseling about physical activity/exercise needs.;Develop an individualized exercise prescription for aerobic and resistive training based on initial evaluation findings, risk stratification, comorbidities and participant's personal goals.       Expected Outcomes  Short Term: Increase workloads from initial exercise prescription for resistance, speed, and METs.;Short Term: Perform resistance training exercises routinely during rehab and add in resistance training at home;Long Term: Improve cardiorespiratory fitness, muscular endurance and strength as measured by increased METs and functional capacity (6MWT)       Able to understand and use rate of perceived exertion (RPE) scale  Yes       Intervention  Provide education and explanation on how to use RPE scale       Expected Outcomes  Short Term: Able to use RPE daily in rehab to express subjective intensity level;Long Term:  Able to use RPE to guide intensity level when exercising independently       Knowledge and understanding of Target Heart Rate Range (THRR)  Yes        Intervention  Provide education and explanation of THRR including how the numbers were predicted and where they are located for reference       Expected Outcomes  Short Term: Able to use daily as guideline for intensity in rehab;Long Term: Able to use THRR to govern intensity when exercising independently;Short Term: Able to state/look up THRR       Able to check pulse independently  Yes       Intervention  Review the importance of being able to check your own pulse for safety during independent exercise;Provide education and demonstration on how to check pulse in carotid and radial arteries.       Expected Outcomes  Short Term: Able to explain why pulse checking is important during independent exercise;Long Term: Able to check pulse independently and accurately       Understanding of Exercise Prescription  Yes       Intervention  Provide education, explanation, and written materials on patient's individual exercise prescription       Expected Outcomes  Short Term: Able to explain program exercise prescription;Long Term: Able to explain home exercise prescription to exercise independently          Exercise Goals Re-Evaluation : Exercise Goals Re-Evaluation    Row Name 11/26/17 0811 12/01/17 1456 12/03/17 0830 12/10/17 0828 12/15/17 1418     Exercise Goal Re-Evaluation   Exercise Goals Review  Increase Physical Activity;Increase Strength and Stamina;Able to understand and use rate of perceived exertion (RPE) scale;Knowledge and understanding of Target Heart Rate Range (THRR);Understanding of Exercise Prescription  Increase Physical Activity;Increase Strength and Stamina;Understanding of Exercise Prescription  Increase Physical Activity;Increase Strength and Stamina;Understanding of Exercise Prescription  Increase Physical Activity;Increase Strength and Stamina;Understanding of Exercise Prescription  Increase Physical Activity;Increase Strength and Stamina;Understanding of Exercise Prescription    Comments  Reviewed RPE scale, THR and program prescription with pt today.  Pt voiced understanding and was given a copy of goals to take home.   Abbe Amsterdam is off to a good start in rehab.  He has completed his first two days in rehab.  We will continue to monitor his progression.   Abbe Amsterdam states that he feels like he is doing  okay so far with exercise. He reports feeling stronger already and is pleased with his progress. He will be having surgery on his left arm September 3, which he doesn't want to let set him back.   Reviewed home exercise with pt today.  Pt plans to walk at home, and use his nustep for exercise.  Reviewed THR, pulse, RPE, sign and symptoms, NTG use, and when to call 911 or MD.  Also discussed weather considerations and indoor options.  Pt voiced understanding.  Abbe Amsterdam has been doing well in rehab.  He has moved up on the NuStep to level 3.  We will continue to monitor his progress.    Expected Outcomes  Short: Use RPE daily to regulate intensity. Long: Follow program prescription in THR.  Short: Continue to attend rehab regularly.  Long: Continue to follow program prescription.   Short: continue to attend rehab regularly. Long: become independent on the exercise equipment.   Short: add an additonal 3 days of exercise at home Long: increase overall activity level   Short: Increase workload on treadmill.  Long: Continue to improve strength and stamina.    [place] Name 12/28/17 1623 01/11/18 1520 01/25/18 1552 02/02/18 1201 02/23/18 1320     Exercise Goal Re-Evaluation   Exercise Goals Review  -  -  -  -  Increase Physical Activity;Increase Strength and Stamina;Understanding of Exercise Prescription   Comments  Out since last review  Out since last review  Out since last review  Out since last review  [name] returned today and was able to pick up with his workloads.  We will continue to monitor his progression now that he is back. We will also revisit his home exercise once he gets back into the swing of  things.    Expected Outcomes  -  -  -  -  Short: Attend regularly again.  Long: Work on Education administrator.    [place] Name 03/02/18 0758 03/08/18 1546 03/23/18 1508 04/05/18 1359       Exercise Goal Re-Evaluation   Exercise Goals Review  Increase Physical Activity;Increase Strength and Stamina;Understanding of Exercise Prescription  Increase Physical Activity;Increase Strength and Stamina;Understanding of Exercise Prescription  -  -    Comments  Abbe Amsterdam is enjoying being back in class. He is using a recumbent bike at home for 20 minutes 2 days per week. Abbe Amsterdam was encouraged to add one more day of home exercise and to exercise for 30 minutes.   Abbe Amsterdam has been doing well in rehab since returning.  He should be ready to start to increase his workloads again.  We will continue to monitor his progress.   Out since last review  Out since last review    Expected Outcomes  Short: Continue coming to class regularly. Long: Add one more day of home exercise and increase time to 30 minutes. Work on Education administrator.  Short: Begin to increase workloads.  Long: Continue to add in more exercise at home.   -  -       Discharge Exercise Prescription (Final Exercise Prescription Changes): Exercise Prescription Changes - 03/08/18 1500      Response to Exercise   Blood Pressure (Admit)  152/70    Blood Pressure (Exercise)  132/72    Blood Pressure (Exit)  142/70    Heart Rate (Admit)  78 bpm    Heart Rate (Exercise)  113 bpm    Heart Rate (Exit)  90 bpm  Rating of Perceived Exertion (Exercise)  15    Symptoms  none    Duration  Continue with 30 min of aerobic exercise without signs/symptoms of physical distress.    Intensity  THRR unchanged      Progression   Progression  Continue to progress workloads to maintain intensity without signs/symptoms of physical distress.    Average METs  2.55      Resistance Training   Training Prescription  Yes    Weight  3 lbs    Reps  10-15       Interval Training   Interval Training  No      Treadmill   MPH  1.5    Grade  0.5    Minutes  15    METs  2.25      NuStep   Level  3    Minutes  15    METs  2.4      Biostep-RELP   Level  2    Minutes  15    METs  3      Home Exercise Plan   Plans to continue exercise at  Home (comment)   walking and nustep    Frequency  Add 3 additional days to program exercise sessions.    Initial Home Exercises Provided  12/10/17       Nutrition:  Target Goals: Understanding of nutrition guidelines, daily intake of sodium <1517m, cholesterol <2039m calories 30% from fat and 7% or less from saturated fats, daily to have 5 or more servings of fruits and vegetables.  Biometrics: Pre Biometrics - 11/23/17 1431      Pre Biometrics   Height  5' 9.4" (1.763 m)    Weight  205 lb 4.8 oz (93.1 kg)    Waist Circumference  42.5 inches    Hip Circumference  41.5 inches    Waist to Hip Ratio  1.02 %    BMI (Calculated)  29.96    Single Leg Stand  14.05 seconds        Nutrition Therapy Plan and Nutrition Goals: Nutrition Therapy & Goals - 11/26/17 0843      Nutrition Therapy   Diet  DM    Drug/Food Interactions  Statins/Certain Fruits    Protein (specify units)  12oz    Fiber  30 grams    Whole Grain Foods  3 servings   eats some whole grains, likes sourdough bread   Saturated Fats  15 max. grams    Fruits and Vegetables  5 servings/day   8 ideal, eats 2 meals/day typically   Sodium  1500 grams      Personal Nutrition Goals   Nutrition Goal  Become more familiar with foods high in potassium and phosphorus. Limit or avoid these foods d/t dialysis treatments   handouts provided which listed foods high in potassium and phosphorus   Personal Goal #2  Eat on a consistent schedule each day, trying not to skip meals or go long periods of time without eating in order to best manage BG levels. Keeping your new schedule of adding breakfast daily would be ideal    Personal Goal #3   Continue to limit sodium intake, paying particular attention to canned items and pre-prepared meals. Upper limit range for sodium on a frozen/ pre-prepared meal is 500-60057mer meal    Comments  He monitors sodium and total fluid intake d/t dialysis treatments. He also avoids some foods with potassium and phosphorus, in addition to being on  medications which limit absorption. He does not follow a diabetic diet but does try to limit his sugar intake and keep carbs "lower"      Intervention Plan   Intervention  Nutrition handout(s) given to patient.;Prescribe, educate and counsel regarding individualized specific dietary modifications aiming towards targeted core components such as weight, hypertension, lipid management, diabetes, heart failure and other comorbidities.   Potssium & sodium content of foods, Controlling your phosphorus    Expected Outcomes  Short Term Goal: Understand basic principles of dietary content, such as calories, fat, sodium, cholesterol and nutrients.;Short Term Goal: A plan has been developed with personal nutrition goals set during dietitian appointment.;Long Term Goal: Adherence to prescribed nutrition plan.       Nutrition Assessments: Nutrition Assessments - 11/23/17 1254      MEDFICTS Scores   Pre Score  52       Nutrition Goals Re-Evaluation: Nutrition Goals Re-Evaluation    Row Name 11/26/17 0909 12/03/17 0836 02/23/18 0843         Goals   Current Weight  -  207 lb (93.9 kg)  -     Nutrition Goal  Become more familiar with foods high in potassium and phosphorus. Limit or avoid these foods d/t dialysis treatments  Still working on becoming familiar with food that contain potassium.   Become more familar with foods high in potassium and phosphorus; eat on a consistent schedule each day; continue to limit sodium intake     Comment  He is on dialysis and monitors potassium and phosphorus in foods to a certain extent but could use a more in-depth review of foods  and beverages that contain these two micronutrients  Abbe Amsterdam says he's been watchig out for foods that are high in potassium for some time now -- says his bllod levels were great the last time he had dialysis.   He states that per dialysis clinic his phosphorus and potassium labs have both been within range. He has been instructed to limit fluids slightly. He has been eating on a consistent schedule and is not skipping meals. He is regaining the 15-20# he lost during previous hospital stay though he would prefer not to gain all the weight back and maintain at a lower body weight     Expected Outcome  He will avoid most foods high in potassium and phosphorus, and limit foods moderate in these micronutrients  Short: become more aware of foods high in potassium Long: avoid foods high in potassium   He will pay close attention to phosphorus and potassium lab values, limit fluids and sodium as instructed, and continue to eat on a consistent schedule as important for dialysis and to manage BG levels       Personal Goal #2 Re-Evaluation   Personal Goal #2  Eat on a consistent schedule each day, trying not to skip meals or go long periods of time without eating to better manage BG levels. Keeping your new schedule of adding breakfast daily would be ideal  -  -       Personal Goal #3 Re-Evaluation   Personal Goal #3  Continue to limit sodium intake- paying particular attention to canned items and pre-prepared meals. Upper limit range for sodium on a frozen/ pre-pepared meal is 500-623m per meal  -  -        Nutrition Goals Discharge (Final Nutrition Goals Re-Evaluation): Nutrition Goals Re-Evaluation - 02/23/18 0843      Goals   Nutrition Goal  Become  more familar with foods high in potassium and phosphorus; eat on a consistent schedule each day; continue to limit sodium intake    Comment  He states that per dialysis clinic his phosphorus and potassium labs have both been within range. He has been instructed to  limit fluids slightly. He has been eating on a consistent schedule and is not skipping meals. He is regaining the 15-20# he lost during previous hospital stay though he would prefer not to gain all the weight back and maintain at a lower body weight    Expected Outcome  He will pay close attention to phosphorus and potassium lab values, limit fluids and sodium as instructed, and continue to eat on a consistent schedule as important for dialysis and to manage BG levels       Psychosocial: Target Goals: Acknowledge presence or absence of significant depression and/or stress, maximize coping skills, provide positive support system. Participant is able to verbalize types and ability to use techniques and skills needed for reducing stress and depression.   Initial Review & Psychosocial Screening: Initial Psych Review & Screening - 11/23/17 1252      Initial Review   Current issues with  Current Stress Concerns    Source of Stress Concerns  Chronic Illness;Unable to perform yard/household activities    Comments  [place] has just been placed on Dialysis (T, Th, Sa) this past month. He is supposed to go for a fistula in October. This has really been hard. He reports a lot of his issues he does not know if it is coming from his recent heart attack or his failing kindeys.       Family Dynamics   Good Support System?  Yes   spouse     Screening Interventions   Interventions  Encouraged to exercise;Program counselor consult;To provide support and resources with identified psychosocial needs;Provide feedback about the scores to participant    Expected Outcomes  Short Term goal: Utilizing psychosocial counselor, staff and physician to assist with identification of specific Stressors or current issues interfering with healing process. Setting desired goal for each stressor or current issue identified.;Long Term Goal: Stressors or current issues are controlled or eliminated.;Short Term goal: Identification and  review with participant of any Quality of Life or Depression concerns found by scoring the questionnaire.;Long Term goal: The participant improves quality of Life and PHQ9 Scores as seen by post scores and/or verbalization of changes       Quality of Life Scores:  Quality of Life - 11/23/17 1253      Quality of Life   Select  Quality of Life      Quality of Life Scores   Health/Function Pre  19.33 %    Socioeconomic Pre  20.63 %    Psych/Spiritual Pre  24.29 %    Family Pre  30 %    GLOBAL Pre  22.14 %      Scores of 19 and below usually indicate a poorer quality of life in these areas.  A difference of  2-3 points is a clinically meaningful difference.  A difference of 2-3 points in the total score of the Quality of Life Index has been associated with significant improvement in overall quality of life, self-image, physical symptoms, and general health in studies assessing change in quality of life.  PHQ-9: Recent Review Flowsheet Data    Depression screen [facility] 2/9 11/23/2017   Decreased Interest 0   Down, Depressed, Hopeless 0   PHQ - 2 Score  0   Altered sleeping 0   Tired, decreased energy 2   Change in appetite 0   Feeling bad or failure about yourself  0   Trouble concentrating 0   Moving slowly or fidgety/restless 0   Suicidal thoughts 0   PHQ-9 Score 2   Difficult doing work/chores Somewhat difficult     Interpretation of Total Score  Total Score Depression Severity:  1-4 = Minimal depression, 5-9 = Mild depression, 10-14 = Moderate depression, 15-19 = Moderately severe depression, 20-27 = Severe depression   Psychosocial Evaluation and Intervention: Psychosocial Evaluation - 12/10/17 1002      Psychosocial Evaluation & Interventions   Interventions  Encouraged to exercise with the program and follow exercise prescription    Comments  Counselor met with Mr. Beavers today for initial psychosocial evaluation.  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  Abbe Amsterdam has a strong support system with a spouse of 59 years; a son in [place] and active involvement in his local church community.  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  [name] sleeps well with a CPAP and has a good appetite.  He denies a history of depression or anxiety or any current symptoms and states he is typically in a positive mood.  [name]'s health is reportedly his primary stressor at this time.  He has goals to increase his stamina while in this program.  Staff will follow with him.    Expected Outcomes  Short:  Abbe Amsterdam will exercise consistently to increase his stamina and for his mental health as a positive coping strategy for stress.   Long:  Abbe Amsterdam will develop a positive and healthy lifestyle routine of exercise and diet and stress management..    Continue Psychosocial Services   Follow up required by staff       Psychosocial Re-Evaluation: Psychosocial Re-Evaluation    [place] Name 12/03/17 [phone] 03/02/18 0804           Psychosocial Re-Evaluation   Current issues with  None Identified  Current Stress Concerns      Comments  Abbe Amsterdam is in good spirits. He has no trouble sleeping, he uses a CPAP and has an inclined bed that helps.   [name] reports being happy and sleeping well. He sleeps with his CPAP and jokes that his sleep is only interrupted when he has to come to Cardiac Rehab early. [name]'s upcoming procedure on 03/09/2018 is a stressor. He is hopeful to have home dyalisis so he sees this procedure as a positive opportunity. He will hear results on 03/18/2018 and how his fistula is healing.      Expected Outcomes  Short: continue to be in good spirits no matter what comes his way Long: stay stress free    Short: Although he has health concerns, he has a great outlook, continue with positive attitude. Long: Happy where he is, just keep it up.         Psychosocial Discharge (Final Psychosocial  Re-Evaluation): Psychosocial Re-Evaluation - 03/02/18 0804      Psychosocial Re-Evaluation   Current issues with  Current Stress Concerns    Comments  Abbe Amsterdam reports being happy and sleeping well. He sleeps with his CPAP and jokes that his sleep is only interrupted when he has to come to Cardiac Rehab early. [name]'s upcoming procedure on 03/09/2018 is a stressor.  He is hopeful to have home dyalisis so he sees this procedure as a positive opportunity. He will hear results on 03/18/2018 and how his fistula is healing.    Expected Outcomes  Short: Although he has health concerns, he has a great outlook, continue with positive attitude. Long: Happy where he is, just keep it up.       Vocational Rehabilitation: Provide vocational rehab assistance to qualifying candidates.   Vocational Rehab Evaluation & Intervention: Vocational Rehab - 11/23/17 1255      Initial Vocational Rehab Evaluation & Intervention   Assessment shows need for Vocational Rehabilitation  No       Education: Education Goals: Education classes will be provided on a variety of topics geared toward better understanding of heart health and risk factor modification. Participant will state understanding/return demonstration of topics presented as noted by education test scores.  Learning Barriers/Preferences: Learning Barriers/Preferences - 11/23/17 1254      Learning Barriers/Preferences   Learning Barriers  None    Learning Preferences  Individual Instruction;Written Material       Education Topics:  AED/CPR: - Group verbal and written instruction with the use of models to demonstrate the basic use of the AED with the basic ABC's of resuscitation.   General Nutrition Guidelines/Fats and Fiber: -Group instruction provided by verbal, written material, models and posters to present the general guidelines for heart healthy nutrition. Gives an explanation and review of dietary fats and fiber.   Controlling Sodium/Reading  Food Labels: -Group verbal and written material supporting the discussion of sodium use in heart healthy nutrition. Review and explanation with models, verbal and written materials for utilization of the food label.   Exercise Physiology & General Exercise Guidelines: - Group verbal and written instruction with models to review the exercise physiology of the cardiovascular system and associated critical values. Provides general exercise guidelines with specific guidelines to those with heart or lung disease.    Cardiac Rehab from 03/08/2018 in [facility] Cardiac and Pulmonary Rehab  Date  03/02/18  Educator  [facility]  Instruction Review Code  1- Verbalizes Understanding      Aerobic Exercise & Resistance Training: - Gives group verbal and written instruction on the various components of exercise. Focuses on aerobic and resistive training programs and the benefits of this training and how to safely progress through these programs..   Cardiac Rehab from 03/08/2018 in [facility] Cardiac and Pulmonary Rehab  Date  03/04/18  Educator  AS  Instruction Review Code  1- Verbalizes Understanding      Flexibility, Balance, Mind/Body Relaxation: Provides group verbal/written instruction on the benefits of flexibility and balance training, including mind/body exercise modes such as yoga, pilates and tai chi.  Demonstration and skill practice provided.   Cardiac Rehab from 03/08/2018 in [facility] Cardiac and Pulmonary Rehab  Date  03/08/18  Educator  [facility]  Instruction Review Code  1- Verbalizes Understanding      Stress and Anxiety: - Provides group verbal and written instruction about the health risks of elevated stress and causes of high stress.  Discuss the correlation between heart/lung disease and anxiety and treatment options. Review healthy ways to manage with stress and anxiety.   Cardiac Rehab from 03/08/2018 in [facility] Cardiac and Pulmonary Rehab  Date  12/01/17  Educator  [facility]  Instruction Review Code  1-  Verbalizes Understanding      Depression: - Provides group verbal and written instruction on the correlation between heart/lung disease and depressed mood, treatment options, and  the stigmas associated with seeking treatment.   Anatomy & Physiology of the Heart: - Group verbal and written instruction and models provide basic cardiac anatomy and physiology, with the coronary electrical and arterial systems. Review of Valvular disease and Heart Failure   Cardiac Rehab from 03/08/2018 in [facility] Cardiac and Pulmonary Rehab  Date  12/03/17  Educator  CE  Instruction Review Code  1- Verbalizes Understanding      Cardiac Procedures: - Group verbal and written instruction to review commonly prescribed medications for heart disease. Reviews the medication, class of the drug, and side effects. Includes the steps to properly store meds and maintain the prescription regimen. (beta blockers and nitrates)   Cardiac Medications I: - Group verbal and written instruction to review commonly prescribed medications for heart disease. Reviews the medication, class of the drug, and side effects. Includes the steps to properly store meds and maintain the prescription regimen.   Cardiac Medications II: -Group verbal and written instruction to review commonly prescribed medications for heart disease. Reviews the medication, class of the drug, and side effects. (all other drug classes)   Cardiac Rehab from 03/08/2018 in [facility] Cardiac and Pulmonary Rehab  Date  11/26/17  Educator  CE  Instruction Review Code  1- Verbalizes Understanding       Go Sex-Intimacy & Heart Disease, Get SMART - Goal Setting: - Group verbal and written instruction through game format to discuss heart disease and the return to sexual intimacy. Provides group verbal and written material to discuss and apply goal setting through the application of the S.M.A.R.T. Method.   Other Matters of the Heart: - Provides group verbal, written  materials and models to describe Stable Angina and Peripheral Artery. Includes description of the disease process and treatment options available to the cardiac patient.   Cardiac Rehab from 03/08/2018 in [facility] Cardiac and Pulmonary Rehab  Date  12/03/17  Educator  CE  Instruction Review Code  1- Verbalizes Understanding      Exercise & Equipment Safety: - Individual verbal instruction and demonstration of equipment use and safety with use of the equipment.   Cardiac Rehab from 03/08/2018 in [facility] Cardiac and Pulmonary Rehab  Date  11/23/17  Educator  [facility]  Instruction Review Code  1- Verbalizes Understanding      Infection Prevention: - Provides verbal and written material to individual with discussion of infection control including proper hand washing and proper equipment cleaning during exercise session.   Cardiac Rehab from 03/08/2018 in [facility] Cardiac and Pulmonary Rehab  Date  11/23/17  Educator  [facility]  Instruction Review Code  1- Verbalizes Understanding      Falls Prevention: - Provides verbal and written material to individual with discussion of falls prevention and safety.   Cardiac Rehab from 03/08/2018 in [facility] Cardiac and Pulmonary Rehab  Date  11/23/17  Educator  [facility]  Instruction Review Code  1- Verbalizes Understanding      Diabetes: - Individual verbal and written instruction to review signs/symptoms of diabetes, desired ranges of glucose level fasting, after meals and with exercise. Acknowledge that pre and post exercise glucose checks will be done for 3 sessions at entry of program.   Cardiac Rehab from 03/08/2018 in [facility] Cardiac and Pulmonary Rehab  Date  11/23/17  Educator  [facility]  Instruction Review Code  1- Verbalizes Understanding      Know Your Numbers and Risk Factors: -Group verbal and written instruction about important numbers in your health.  Discussion of what are  risk factors and how they play a role in the disease process.  Review of Cholesterol, Blood  Pressure, Diabetes, and BMI and the role they play in your overall health.   Cardiac Rehab from 03/08/2018 in [facility] Cardiac and Pulmonary Rehab  Date  11/26/17  Educator  CE  Instruction Review Code  1- Verbalizes Understanding      Sleep Hygiene: -Provides group verbal and written instruction about how sleep can affect your health.  Define sleep hygiene, discuss sleep cycles and impact of sleep habits. Review good sleep hygiene tips.    Cardiac Rehab from 03/08/2018 in [facility] Cardiac and Pulmonary Rehab  Date  02/23/18  Educator  [facility]  Instruction Review Code  1- Verbalizes Understanding      Other: -Provides group and verbal instruction on various topics (see comments)   Knowledge Questionnaire Score: Knowledge Questionnaire Score - 11/23/17 1254      Knowledge Questionnaire Score   Pre Score  25/26   correct answers reviewed with patient, focus on exercise      Core Components/Risk Factors/Patient Goals at Admission: Personal Goals and Risk Factors at Admission - 11/23/17 1255      Core Components/Risk Factors/Patient Goals on Admission    Weight Management  Yes;Weight Loss    Intervention  Weight Management: Develop a combined nutrition and exercise program designed to reach desired caloric intake, while maintaining appropriate intake of nutrient and fiber, sodium and fats, and appropriate energy expenditure required for the weight goal.;Weight Management: Provide education and appropriate resources to help participant work on and attain dietary goals.;Weight Management/Obesity: Establish reasonable short term and long term weight goals.    Admit Weight  205 lb (93 kg)    Goal Weight: Short Term  200 lb (90.7 kg)    Goal Weight: Long Term  200 lb (90.7 kg)    Expected Outcomes  Short Term: Continue to assess and modify interventions until short term weight is achieved;Long Term: Adherence to nutrition and physical activity/exercise program aimed toward attainment of established  weight goal;Weight Loss: Understanding of general recommendations for a balanced deficit meal plan, which promotes 1-2 lb weight loss per week and includes a negative energy balance of [phone] kcal/d;Understanding recommendations for meals to include 15-35% energy as protein, 25-35% energy from fat, 35-60% energy from carbohydrates, less than 256m of dietary cholesterol, 20-35 gm of total fiber daily;Understanding of distribution of calorie intake throughout the day with the consumption of 4-5 meals/snacks    Diabetes  Yes    Intervention  Provide education about signs/symptoms and action to take for hypo/hyperglycemia.;Provide education about proper nutrition, including hydration, and aerobic/resistive exercise prescription along with prescribed medications to achieve blood glucose in normal ranges: Fasting glucose 65-99 mg/dL    Expected Outcomes  Short Term: Participant verbalizes understanding of the signs/symptoms and immediate care of hyper/hypoglycemia, proper foot care and importance of medication, aerobic/resistive exercise and nutrition plan for blood glucose control.;Long Term: Attainment of HbA1C < 7%.    Hypertension  Yes    Intervention  Monitor prescription use compliance.;Provide education on lifestyle modifcations including regular physical activity/exercise, weight management, moderate sodium restriction and increased consumption of fresh fruit, vegetables, and low fat dairy, alcohol moderation, and smoking cessation.    Expected Outcomes  Short Term: Continued assessment and intervention until BP is < 140/978mHG in hypertensive participants. < 130/8082mG in hypertensive participants with diabetes, heart failure or chronic kidney disease.;Long Term: Maintenance of blood pressure at goal levels.    Lipids  Yes    Intervention  Provide education and support for participant on nutrition & aerobic/resistive exercise along with prescribed medications to achieve LDL <52m, HDL >423m     Expected Outcomes  Short Term: Participant states understanding of desired cholesterol values and is compliant with medications prescribed. Participant is following exercise prescription and nutrition guidelines.;Long Term: Cholesterol controlled with medications as prescribed, with individualized exercise RX and with personalized nutrition plan. Value goals: LDL < 7076mHDL > 40 mg.       Core Components/Risk Factors/Patient Goals Review:  Goals and Risk Factor Review    Row Name 12/03/17 0837253/19/19 0807           Core Components/Risk Factors/Patient Goals Review   Personal Goals Review  Weight Management/Obesity;Diabetes;Hypertension  Weight Management/Obesity;Diabetes;Hypertension      Review  PhiAbbe Amsterdamkes his medications as prescribed and is keeping his diabetes under control. 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 He would like to lose a few pounds to feel better.   PhiAbbe Amsterdam taking his meds as directed and has an upcoming procedure to consider home dialysis which would require a different port (mode). He would like to lose a few pounds but is also happy where he is. Blood glucose 140-180m23m one hour after eating. He is on a sliding scale with insulin so makes adjustments when needed. Blood pressure at home 151/64 before last med change. Dialysis drops blood pressure about 20 to 131/64. Doctor has changed time of day to take blood pressure meds. Monitoring and doctor will adjust meds as needed.       Expected Outcomes  Short: continue to take medications as directed. Long: lose 5 pounds through diet and exercise   Short: Continue to take meds as directed, monitor blood glucose, home blood pressure readings, and use CPAP. Long: lose 5 pounds through diet and exercise.         Core Components/Risk Factors/Patient Goals at Discharge (Final Review):  Goals and Risk Factor Review - 03/02/18 0807      Core Components/Risk Factors/Patient Goals Review   Personal  Goals Review  Weight Management/Obesity;Diabetes;Hypertension    Review  PhilAbbe Amsterdamtaking his meds as directed and has an upcoming procedure to consider home dialysis which would require a different port (mode). He would like to lose a few pounds but is also happy where he is. Blood glucose 140-180mg42mone hour after eating. He is on a sliding scale with insulin so makes adjustments when needed. Blood pressure at home 151/64 before last med change. Dialysis drops blood pressure about 20 to 131/64. Doctor has changed time of day to take blood pressure meds. Monitoring and doctor will adjust meds as needed.     Expected Outcomes  Short: Continue to take meds as directed, monitor blood glucose, home blood pressure readings, and use CPAP. Long: lose 5 pounds through diet and exercise.       ITP Comments: ITP Comments    Row Name 11/23/17 1216 11/25/17 0817 12/10/17 0818 12/22/17 1531 12/23/17 0616   ITP Comments  Med Review completed. Initial ITP created. [facility] 30 day review completed. ITP sent to Dr. Bert Ramonita Labering for Dr. Mark Emily Filbertical Director of Cardiac Rehab. Continue with ITP unless changes are made by physician.  New to program.  Has not started to exercise yet.   [name] let us [place] today that he will be having a fistula  placed in his left arm next Tuesday. We talked about making sure he gets clearance before returning to rehab.  Spoke with wife.  [name] had his fistula placed last week.  He has his follow scheduled for next week.  Reminded her that we will need clearance for him to return.   30 day review completed. ITP sent to Dr. Emily Filbert, Medical Director of Cardiac Rehab. Continue with ITP unless changes are made by physician  Out for medical reason   [place] Name 01/11/18 1520 01/20/18 0750 01/25/18 1359 02/02/18 1201 02/17/18 0549   ITP Comments  Called to check on patient.  He has a follow up appointment from his fistula placement this  Wednesday.  He will need clearance to return to rehab.  He is hoping to be able to come back either this Thursday or next Tuesday.   30 day review completed. ITP sent to Dr. Emily Filbert, Medical Director of Cardiac Rehab. Continue with ITP unless changes are made by physician O[place] for medical reason  Called to check on [name].  Out since 8/29 for fistula placement.  Was supposed to return last week.  Unable to leave message.  Will give until end of week, then will send letter to discharge.   Called to check on [name]. [facility] to be cleared to return. Spoke with his wife. He has the appointment date in his phone and will let us know when that is.  He will returned once cleared.   30 day review. Continue with ITP unless direccted changes per Medical Director Chart Review.   Row Name 02/18/18 1315 02/23/18 0815 03/17/18 0553 03/23/18 1503 04/05/18 1358   ITP Comments  [name] called to let us know that he was finally cleared to return to rehab.  He has a letter to bring in clearing him.  He hopes to return on Tuesday 11/12.  [name] returned today for first time since fistula was placed.  He did bring back a note releasing him and it was placed in his chart.    30 day review. Continue with ITP unless direccted changes per Medical Director Chart Review.  Called to check on Abbe Amsterdam and spoke with his wife.  He has another procedure done on his fistula.  He is also getting an abdominal line placed on 03/29/18.  He will need clearance to return again.  Reminded wife that his current order is only good through 04/28/18.   Abbe Amsterdam continues to remain out waiting for clearance.    [place] Name 04/13/18 0619           ITP Comments  30 Day Review. Continue with ITP unless directed changes per Medical Director review.  Waiting for clearance to return to program after fistula surgery.          Comments:

## 2018-04-15 ENCOUNTER — Encounter: Payer: No Typology Code available for payment source | Attending: Internal Medicine

## 2018-04-15 DIAGNOSIS — I214 Non-ST elevation (NSTEMI) myocardial infarction: Secondary | ICD-10-CM | POA: Insufficient documentation

## 2018-04-15 DIAGNOSIS — Z794 Long term (current) use of insulin: Secondary | ICD-10-CM | POA: Insufficient documentation

## 2018-04-15 DIAGNOSIS — Z955 Presence of coronary angioplasty implant and graft: Secondary | ICD-10-CM | POA: Insufficient documentation

## 2018-04-15 DIAGNOSIS — Z79899 Other long term (current) drug therapy: Secondary | ICD-10-CM | POA: Insufficient documentation

## 2018-04-20 ENCOUNTER — Telehealth: Payer: Self-pay | Admitting: *Deleted

## 2018-04-20 ENCOUNTER — Encounter: Payer: Self-pay | Admitting: *Deleted

## 2018-04-20 NOTE — Progress Notes (Signed)
Discharge Progress Report  Patient Details  Name: Todd Barr MRN: [id number] Date of Birth: 11-Oct-1945 Referring Provider:     Cardiac Rehab from 11/23/2017 in [facility]  Referring Provider  Barbie Haggis MD ([place])       Number of Visits: 18/36  Reason for Discharge:  Early Exit:  Personal and Readmissions  Smoking History:  Social History   Tobacco Use  Smoking Status Never Smoker  Smokeless Tobacco Never Used    Diagnosis:  No diagnosis found.  ADL [facility]:   Initial Exercise Prescription: Initial Exercise Prescription - 11/23/17 1400      Date of Initial Exercise RX and Referring Provider   Date  11/23/17    Referring Provider  Barbie Haggis [place] ([facility])      Treadmill   MPH  2    Grade  0.5    Minutes  15    METs  2.67      NuStep   Level  2    SPM  80    Minutes  15    METs  2      Biostep-RELP   Level  2    SPM  50    Minutes  15    METs  2      Prescription Details   Frequency (times per week)  2    Duration  Progress to 30 minutes of continuous aerobic without signs/symptoms of physical distress      Intensity   THRR 40-80% of Max Heartrate  105-135    Ratings of Perceived Exertion  11-13    Perceived Dyspnea  0-4      Progression   Progression  Continue to progress workloads to maintain intensity without signs/symptoms of physical distress.      Resistance Training   Training Prescription  Yes    Weight  3 lbs    Reps  10-15       Discharge Exercise Prescription (Final Exercise Prescription Changes): Exercise Prescription Changes - 03/08/18 1500      Response to Exercise   Blood Pressure (Admit)  152/70    Blood Pressure (Exercise)  132/72    Blood Pressure (Exit)  142/70    Heart Rate (Admit)  78 bpm    Heart Rate (Exercise)  113 bpm    Heart Rate (Exit)  90 bpm    Rating of Perceived Exertion (Exercise)  15    Symptoms  none    Duration  Continue with 30 min of aerobic exercise without signs/symptoms of  physical distress.    Intensity  THRR unchanged      Progression   Progression  Continue to progress workloads to maintain intensity without signs/symptoms of physical distress.    Average METs  2.55      Resistance Training   Training Prescription  Yes    Weight  3 lbs    Reps  10-15      Interval Training   Interval Training  No      Treadmill   MPH  1.5    Grade  0.5    Minutes  15    METs  2.25      NuStep   Level  3    Minutes  15    METs  2.4      Biostep-RELP   Level  2    Minutes  15    METs  3      Home Exercise Plan  Plans to continue exercise at  Home (comment)   walking and nustep    Frequency  Add 3 additional days to program exercise sessions.    Initial Home Exercises Provided  12/10/17       Functional Capacity: 6 Minute Walk    Row Name 11/23/17 1427         6 Minute Walk   Phase  Initial     Distance  1115 feet     Walk Time  6 minutes     # of Rest Breaks  0     MPH  2.11     METS  2.89     RPE  12     VO2 Peak  10.09     Symptoms  No     Resting HR  75 bpm     Resting BP  146/60     Resting Oxygen Saturation   99 %     Exercise Oxygen Saturation  during 6 min walk  97 %     Max Ex. HR  119 bpm     Max Ex. BP  158/84     2 Minute Post BP  134/64        Psychological, QOL, Others - Outcomes: PHQ 2/9: Depression screen PHQ 2/9 11/23/2017  Decreased Interest 0  Down, Depressed, Hopeless 0  PHQ - 2 Score 0  Altered sleeping 0  Tired, decreased energy 2  Change in appetite 0  Feeling bad or failure about yourself  0  Trouble concentrating 0  Moving slowly or fidgety/restless 0  Suicidal thoughts 0  PHQ-9 Score 2  Difficult doing work/chores Somewhat difficult    Quality of Life: Quality of Life - 11/23/17 1253      Quality of Life   Select  Quality of Life      Quality of Life Scores   Health/Function Pre  19.33 %    Socioeconomic Pre  20.63 %    Psych/Spiritual Pre  24.29 %    Family Pre  30 %    GLOBAL Pre   22.14 %       Personal Goals: Goals established at orientation with interventions provided to work toward goal. Personal Goals and Risk Factors at Admission - 11/23/17 1255      Core Components/Risk Factors/Patient Goals on Admission    Weight Management  Yes;Weight Loss    Intervention  Weight Management: Develop a combined nutrition and exercise program designed to reach desired caloric intake, while maintaining appropriate intake of nutrient and fiber, sodium and fats, and appropriate energy expenditure required for the weight goal.;Weight Management: Provide education and appropriate resources to help participant work on and attain dietary goals.;Weight Management/Obesity: Establish reasonable short term and long term weight goals.    Admit Weight  205 lb (93 kg)    Goal Weight: Short Term  200 lb (90.7 kg)    Goal Weight: Long Term  200 lb (90.7 kg)    Expected Outcomes  Short Term: Continue to assess and modify interventions until short term weight is achieved;Long Term: Adherence to nutrition and physical activity/exercise program aimed toward attainment of established weight goal;Weight Loss: Understanding of general recommendations for a balanced deficit meal plan, which promotes 1-2 lb weight loss per week and includes a negative energy balance of [phone] kcal/d;Understanding recommendations for meals to include 15-35% energy as protein, 25-35% energy from fat, 35-60% energy from carbohydrates, less than 200mg  of dietary cholesterol, 20-35 gm of total fiber daily;Understanding  of distribution of calorie intake throughout the day with the consumption of 4-5 meals/snacks    Diabetes  Yes    Intervention  Provide education about signs/symptoms and action to take for hypo/hyperglycemia.;Provide education about proper nutrition, including hydration, and aerobic/resistive exercise prescription along with prescribed medications to achieve blood glucose in normal ranges: Fasting glucose 65-99  mg/dL    Expected Outcomes  Short Term: Participant verbalizes understanding of the signs/symptoms and immediate care of hyper/hypoglycemia, proper foot care and importance of medication, aerobic/resistive exercise and nutrition plan for blood glucose control.;Long Term: Attainment of HbA1C < 7%.    Hypertension  Yes    Intervention  Monitor prescription use compliance.;Provide education on lifestyle modifcations including regular physical activity/exercise, weight management, moderate sodium restriction and increased consumption of fresh fruit, vegetables, and low fat dairy, alcohol moderation, and smoking cessation.    Expected Outcomes  Short Term: Continued assessment and intervention until BP is < 140/10mm HG in hypertensive participants. < 130/58mm HG in hypertensive participants with diabetes, heart failure or chronic kidney disease.;Long Term: Maintenance of blood pressure at goal levels.    Lipids  Yes    Intervention  Provide education and support for participant on nutrition & aerobic/resistive exercise along with prescribed medications to achieve LDL 70mg , HDL >40mg .    Expected Outcomes  Short Term: Participant states understanding of desired cholesterol values and is compliant with medications prescribed. Participant is following exercise prescription and nutrition guidelines.;Long Term: Cholesterol controlled with medications as prescribed, with individualized exercise RX and with personalized nutrition plan. Value goals: LDL < 70mg , HDL > 40 mg.        Personal Goals Discharge: Goals and Risk Factor Review    Row Name 12/03/17 7893 03/02/18 0807           Core Components/Risk Factors/Patient Goals Review   Personal Goals Review  Weight Management/Obesity;Diabetes;Hypertension  Weight Management/Obesity;Diabetes;Hypertension      Review  Todd Barr takes his medications as prescribed and is keeping his diabetes under control. He is recieving dialysis 3 days a week (Tuesday, Thursday and  Saturday). He would like to lose a few pounds to feel better.   Todd Barr is taking his meds as directed and has an upcoming procedure to consider home dialysis which would require a different port (mode). He would like to lose a few pounds but is also happy where he is. Blood glucose 140-180mg .dL one hour after eating. He is on a sliding scale with insulin so makes adjustments when needed. Blood pressure at home 151/64 before last med change. Dialysis drops blood pressure about 20 to 131/64. Doctor has changed time of day to take blood pressure meds. Monitoring and doctor will adjust meds as needed.       Expected Outcomes  Short: continue to take medications as directed. Long: lose 5 pounds through diet and exercise   Short: Continue to take meds as directed, monitor blood glucose, home blood pressure readings, and use CPAP. Long: lose 5 pounds through diet and exercise.         Exercise Goals and Review: Exercise Goals    Row Name 11/23/17 1430             Exercise Goals   Increase Physical Activity  Yes       Intervention  Provide advice, education, support and counseling about physical activity/exercise needs.;Develop an individualized exercise prescription for aerobic and resistive training based on initial evaluation findings, risk stratification, comorbidities and participant's personal goals.  Expected Outcomes  Short Term: Attend rehab on a regular basis to increase amount of physical activity.;Long Term: Add in home exercise to make exercise part of routine and to increase amount of physical activity.;Long Term: Exercising regularly at least 3-5 days a week.       Increase Strength and Stamina  Yes       Intervention  Provide advice, education, support and counseling about physical activity/exercise needs.;Develop an individualized exercise prescription for aerobic and resistive training based on initial evaluation findings, risk stratification, comorbidities and participant's personal  goals.       Expected Outcomes  Short Term: Increase workloads from initial exercise prescription for resistance, speed, and METs.;Short Term: Perform resistance training exercises routinely during rehab and add in resistance training at home;Long Term: Improve cardiorespiratory fitness, muscular endurance and strength as measured by increased METs and functional capacity (6MWT)       Able to understand and use rate of perceived exertion (RPE) scale  Yes       Intervention  Provide education and explanation on how to use RPE scale       Expected Outcomes  Short Term: Able to use RPE daily in rehab to express subjective intensity level;Long Term:  Able to use RPE to guide intensity level when exercising independently       Knowledge and understanding of Target Heart Rate Range (THRR)  Yes       Intervention  Provide education and explanation of THRR including how the numbers were predicted and where they are located for reference       Expected Outcomes  Short Term: Able to use daily as guideline for intensity in rehab;Long Term: Able to use THRR to govern intensity when exercising independently;Short Term: Able to state/look up THRR       Able to check pulse independently  Yes       Intervention  Review the importance of being able to check your own pulse for safety during independent exercise;Provide education and demonstration on how to check pulse in carotid and radial arteries.       Expected Outcomes  Short Term: Able to explain why pulse checking is important during independent exercise;Long Term: Able to check pulse independently and accurately       Understanding of Exercise Prescription  Yes       Intervention  Provide education, explanation, and written materials on patient's individual exercise prescription       Expected Outcomes  Short Term: Able to explain program exercise prescription;Long Term: Able to explain home exercise prescription to exercise independently          Exercise  Goals Re-Evaluation: Exercise Goals Re-Evaluation    Row Name 11/26/17 0811 12/01/17 1456 12/03/17 0830 12/10/17 0828 12/15/17 1418     Exercise Goal Re-Evaluation   Exercise Goals Review  Increase Physical Activity;Increase Strength and Stamina;Able to understand and use rate of perceived exertion (RPE) scale;Knowledge and understanding of Target Heart Rate Range (THRR);Understanding of Exercise Prescription  Increase Physical Activity;Increase Strength and Stamina;Understanding of Exercise Prescription  Increase Physical Activity;Increase Strength and Stamina;Understanding of Exercise Prescription  Increase Physical Activity;Increase Strength and Stamina;Understanding of Exercise Prescription  Increase Physical Activity;Increase Strength and Stamina;Understanding of Exercise Prescription   Comments  Reviewed RPE scale, THR and program prescription with pt today.  Pt voiced understanding and was given a copy of goals to take home.   Todd Barr is off to a good start in rehab.  He has completed his first two  days in rehab.  We will continue to monitor his progression.   Todd Barr states that he feels like he is doing okay so far with exercise. He reports feeling stronger already and is pleased with his progress. He will be having surgery on his left arm September 3, which he doesn't want to let set him back.   Reviewed home exercise with pt today.  Pt plans to walk at home, and use his nustep for exercise.  Reviewed THR, pulse, RPE, sign and symptoms, NTG use, and when to call 911 or MD.  Also discussed weather considerations and indoor options.  Pt voiced understanding.  Todd Barr has been doing well in rehab.  He has moved up on the NuStep to level 3.  We will continue to monitor his progress.    Expected Outcomes  Short: Use RPE daily to regulate intensity. Long: Follow program prescription in THR.  Short: Continue to attend rehab regularly.  Long: Continue to follow program prescription.   Short: continue to attend rehab  regularly. Long: become independent on the exercise equipment.   Short: add an additonal 3 days of exercise at home Long: increase overall activity level   Short: Increase workload on treadmill.  Long: Continue to improve strength and stamina.    [place] Name 12/28/17 1623 01/11/18 1520 01/25/18 1552 02/02/18 1201 02/23/18 1320     Exercise Goal Re-Evaluation   Exercise Goals Review  -  -  -  -  Increase Physical Activity;Increase Strength and Stamina;Understanding of Exercise Prescription   Comments  Out since last review  Out since last review  Out since last review  Out since last review  Todd Barr returned today and was able to pick up with his workloads.  We will continue to monitor his progression now that he is back. We will also revisit his home exercise once he gets back into the swing of things.    Expected Outcomes  -  -  -  -  Short: Attend regularly again.  Long: Work on Education administrator.    [place] Name 03/02/18 0758 03/08/18 1546 03/23/18 1508 04/05/18 1359       Exercise Goal Re-Evaluation   Exercise Goals Review  Increase Physical Activity;Increase Strength and Stamina;Understanding of Exercise Prescription  Increase Physical Activity;Increase Strength and Stamina;Understanding of Exercise Prescription  -  -    Comments  Todd Barr is enjoying being back in class. He is using a recumbent bike at home for 20 minutes 2 days per week. Todd Barr was encouraged to add one more day of home exercise and to exercise for 30 minutes.   Todd Barr has been doing well in rehab since returning.  He should be ready to start to increase his workloads again.  We will continue to monitor his progress.   Out since last review  Out since last review    Expected Outcomes  Short: Continue coming to class regularly. Long: Add one more day of home exercise and increase time to 30 minutes. Work on Education administrator.  Short: Begin to increase workloads.  Long: Continue to add in more exercise at home.   -  -        Nutrition & Weight - Outcomes: Pre Biometrics - 11/23/17 1431      Pre Biometrics   Height  5' 9.4" (1.763 m)    Weight  205 lb 4.8 oz (93.1 kg)    Waist Circumference  42.5 inches    Hip Circumference  41.5  inches    Waist to Hip Ratio  1.02 %    BMI (Calculated)  29.96    Single Leg Stand  14.05 seconds        Nutrition: Nutrition Therapy & Goals - 11/26/17 0843      Nutrition Therapy   Diet  DM    Drug/Food Interactions  Statins/Certain Fruits    Protein (specify units)  12oz    Fiber  30 grams    Whole Grain Foods  3 servings   eats some whole grains, likes sourdough bread   Saturated Fats  15 max. grams    Fruits and Vegetables  5 servings/day   8 ideal, eats 2 meals/day typically   Sodium  1500 grams      Personal Nutrition Goals   Nutrition Goal  Become more familiar with foods high in potassium and phosphorus. Limit or avoid these foods d/t dialysis treatments   handouts provided which listed foods high in potassium and phosphorus   Personal Goal #2  Eat on a consistent schedule each day, trying not to skip meals or go long periods of time without eating in order to best manage BG levels. Keeping your new schedule of adding breakfast daily would be ideal    Personal Goal #3  Continue to limit sodium intake, paying particular attention to canned items and pre-prepared meals. Upper limit range for sodium on a frozen/ pre-prepared meal is 500-600mg  per meal    Comments  He monitors sodium and total fluid intake d/t dialysis treatments. He also avoids some foods with potassium and phosphorus, in addition to being on medications which limit absorption. He does not follow a diabetic diet but does try to limit his sugar intake and keep carbs "lower"      Intervention Plan   Intervention  Nutrition handout(s) given to patient.;Prescribe, educate and counsel regarding individualized specific dietary modifications aiming towards targeted core components such as weight,  hypertension, lipid management, diabetes, heart failure and other comorbidities.   Potssium & sodium content of foods, Controlling your phosphorus    Expected Outcomes  Short Term Goal: Understand basic principles of dietary content, such as calories, fat, sodium, cholesterol and nutrients.;Short Term Goal: A plan has been developed with personal nutrition goals set during dietitian appointment.;Long Term Goal: Adherence to prescribed nutrition plan.       Nutrition Discharge: Nutrition Assessments - 11/23/17 1254      MEDFICTS Scores   Pre Score  52       Education Questionnaire Score: Knowledge Questionnaire Score - 11/23/17 1254      Knowledge Questionnaire Score   Pre Score  25/26   correct answers reviewed with patient, focus on exercise      Goals reviewed with patient; copy given to patient.

## 2018-04-20 NOTE — Telephone Encounter (Signed)
Called to check on Todd Barr.  His next surgery is scheduled for 04/26/2018 and his referral runs out on 04/28/2018.  We will discharge him at this time and hope to see him back with a new referral.  He completed 18 sessions.

## 2018-04-20 NOTE — Progress Notes (Signed)
Cardiac Individual Treatment Plan  Patient Details  Name: Todd Barr MRN: [id number] Date of Birth: 28-Dec-1945 Referring Provider:     Cardiac Rehab from 11/23/2017 in [facility] Cardiac [facility] Rehab  Referring Provider  Barbie Haggis MD ([place])      Initial Encounter Date:    Cardiac Rehab from 11/23/2017 in [facility] Cardiac and Pulmonary Rehab  Date  11/23/17      Visit Diagnosis: No diagnosis found.  Patient's Home Medications on Admission:  Current Outpatient Medications:  .  amLODipine (NORVASC) 10 MG tablet, Take 10 mg by mouth daily., Disp: , Rfl:  .  aspirin 81 MG chewable tablet, Chew by mouth., Disp: , Rfl:  .  atorvastatin (LIPITOR) 80 MG tablet, Take 80 mg by mouth at bedtime., Disp: , Rfl:  .  calcitRIOL (ROCALTROL) 0.25 MCG capsule, Take 0.25 mcg by mouth daily., Disp: , Rfl:  .  calcium acetate (PHOSLO) 667 MG capsule, Take 1,334 mg by mouth 2 (two) times daily with a meal., Disp: , Rfl:  .  Cholecalciferol 1000 units tablet, Take 1,000 Units by mouth daily., Disp: , Rfl:  .  clopidogrel (PLAVIX) 75 MG tablet, Take 75 mg by mouth daily., Disp: , Rfl:  .  ferrous sulfate 325 (65 FE) MG EC tablet, Take 325 mg by mouth 3 (three) times daily with meals., Disp: , Rfl:  .  glucose blood test strip, 1 each by Other route as needed for other. Use as instructed, Disp: , Rfl:  .  hydrALAZINE (APRESOLINE) 50 MG tablet, Take 50 mg by mouth 3 (three) times daily., Disp: , Rfl:  .  insulin glargine (LANTUS) 100 UNIT/ML injection, Inject 25 Units into the skin 2 (two) times daily., Disp: , Rfl:  .  insulin regular (NOVOLIN R,HUMULIN R) 100 units/mL injection, Inject 15 Units into the skin 2 (two) times daily before a meal., Disp: , Rfl:  .  isosorbide mononitrate (IMDUR) 60 MG 24 hr tablet, Take 60 mg by mouth every morning., Disp: , Rfl:  .  magnesium oxide (MAG-OX) 400 MG tablet, Take 400 mg by mouth daily., Disp: , Rfl:  .  metoprolol (TOPROL-XL) 200 MG 24 hr tablet, Take 100 mg by  mouth daily., Disp: , Rfl:  .  nitroGLYCERIN (NITROSTAT) 0.4 MG SL tablet, Place 0.4 mg under the tongue every 5 (five) minutes as needed for chest pain., Disp: , Rfl:  .  omeprazole (PRILOSEC) 20 MG capsule, Take 20 mg by mouth every morning., Disp: , Rfl:  .  ranitidine (ZANTAC) 150 MG tablet, Take 150 mg by mouth 2 (two) times daily as needed for heartburn., Disp: , Rfl:  .  simethicone (MYLICON) 80 MG chewable tablet, Chew 80 mg by mouth 3 (three) times daily as needed for flatulence., Disp: , Rfl:  .  sodium bicarbonate 650 MG tablet, Take 650 mg by mouth 2 (two) times daily., Disp: , Rfl:   Past Medical History: No past medical history on file.  Tobacco Use: Social History   Tobacco Use  Smoking Status Never Smoker  Smokeless Tobacco Never Used    Labs: Recent Review Flowsheet Data    There is no flowsheet data to display.       Exercise Target Goals: Exercise Program Goal: Individual exercise prescription set using results from initial 6 min walk test and THRR while considering  patient's activity barriers and safety.   Exercise Prescription Goal: Initial exercise prescription builds to 30-45 minutes a day of aerobic activity, 2-3 days per  week.  Home exercise guidelines will be given to patient during program as part of exercise prescription that the participant will acknowledge.  Activity Barriers & Risk Stratification: Activity Barriers & Cardiac Risk Stratification - 11/23/17 1251      Activity Barriers & Cardiac Risk Stratification   Activity Barriers  Arthritis;Neck/Spine Problems;Deconditioning;Muscular Weakness    Cardiac Risk Stratification  High       6 Minute Walk: 6 Minute Walk    Row Name 11/23/17 1427         6 Minute Walk   Phase  Initial     Distance  1115 feet     Walk Time  6 minutes     # of Rest Breaks  0     MPH  2.11     METS  2.89     RPE  12     VO2 Peak  10.09     Symptoms  No     Resting HR  75 bpm     Resting BP  146/60      Resting Oxygen Saturation   99 %     Exercise Oxygen Saturation  during 6 min walk  97 %     Max Ex. HR  119 bpm     Max Ex. BP  158/84     2 Minute Post BP  134/64        Oxygen Initial Assessment:   Oxygen Re-Evaluation:   Oxygen Discharge (Final Oxygen Re-Evaluation):   Initial Exercise Prescription: Initial Exercise Prescription - 11/23/17 1400      Date of Initial Exercise RX and Referring Provider   Date  11/23/17    Referring Provider  Barbie Haggis MD ([place])      Treadmill   MPH  2    Grade  0.5    Minutes  15    METs  2.67      NuStep   Level  2    SPM  80    Minutes  15    METs  2      Biostep-RELP   Level  2    SPM  50    Minutes  15    METs  2      Prescription Details   Frequency (times per week)  2    Duration  Progress to 30 minutes of continuous aerobic without signs/symptoms of physical distress      Intensity   THRR 40-80% of Max Heartrate  105-135    Ratings of Perceived Exertion  11-13    Perceived Dyspnea  0-4      Progression   Progression  Continue to progress workloads to maintain intensity without signs/symptoms of physical distress.      Resistance Training   Training Prescription  Yes    Weight  3 lbs    Reps  10-15       Perform Capillary Blood Glucose checks as needed.  Exercise Prescription Changes: Exercise Prescription Changes    Row Name 11/23/17 1300 12/01/17 1400 12/10/17 0800 12/15/17 1400 02/23/18 1300     Response to Exercise   Blood Pressure (Admit)  146/60  140/66  -  155/60  160/64   Blood Pressure (Exercise)  158/84  154/66  -  166/68  170/80   Blood Pressure (Exit)  134/64  144/50  -  154/58  150/70   Heart Rate (Admit)  75 bpm  99 bpm  -  81 bpm  89 bpm  Heart Rate (Exercise)  119 bpm  121 bpm  -  115 bpm  109 bpm   Heart Rate (Exit)  81 bpm  94 bpm  -  86 bpm  96 bpm   Oxygen Saturation (Admit)  99 %  -  -  -  -   Oxygen Saturation (Exercise)  97 %  -  -  -  -   Rating of Perceived Exertion  (Exercise)  12  13  -  13  13   Symptoms  none  none  -  none  none   Comments  walk test results  second full day of exercise  -  -  -   Duration  -  Continue with 30 min of aerobic exercise without signs/symptoms of physical distress.  -  Continue with 30 min of aerobic exercise without signs/symptoms of physical distress.  Continue with 30 min of aerobic exercise without signs/symptoms of physical distress.   Intensity  -  THRR unchanged  -  THRR unchanged  THRR unchanged     Progression   Progression  -  Continue to progress workloads to maintain intensity without signs/symptoms of physical distress.  -  Continue to progress workloads to maintain intensity without signs/symptoms of physical distress.  Continue to progress workloads to maintain intensity without signs/symptoms of physical distress.   Average METs  -  2.18  -  2.38  2.15     Resistance Training   Training Prescription  -  Yes  -  Yes  Yes   Weight  -  3 lbs  -  4 lbs  3 lbs   Reps  -  10-15  -  10-15  10-15     Interval Training   Interval Training  -  No  -  No  No     Treadmill   MPH  -  1.5  -  1.5  -   Grade  -  0.5  -  0.5  -   Minutes  -  15  -  15  -   METs  -  2.25  -  2.25  -     NuStep   Level  -  2  -  3  3   Minutes  -  15  -  15  15   METs  -  2.3  -  2.9  2.3     Biostep-RELP   Level  -  2  -  2  2   Minutes  -  15  -  15  15   METs  -  2  -  2  2     Home Exercise Plan   Plans to continue exercise at  -  -  Home (comment) walking and nustep   Home (comment) walking and nustep   Home (comment) walking and nustep    Frequency  -  -  Add 3 additional days to program exercise sessions.  Add 3 additional days to program exercise sessions.  Add 3 additional days to program exercise sessions.   Initial Home Exercises Provided  -  -  12/10/17  12/10/17  12/10/17   Row Name 03/08/18 1500             Response to Exercise   Blood Pressure (Admit)  152/70       Blood Pressure (Exercise)  132/72        Blood Pressure (Exit)  142/70  Heart Rate (Admit)  78 bpm       Heart Rate (Exercise)  113 bpm       Heart Rate (Exit)  90 bpm       Rating of Perceived Exertion (Exercise)  15       Symptoms  none       Duration  Continue with 30 min of aerobic exercise without signs/symptoms of physical distress.       Intensity  THRR unchanged         Progression   Progression  Continue to progress workloads to maintain intensity without signs/symptoms of physical distress.       Average METs  2.55         Resistance Training   Training Prescription  Yes       Weight  3 lbs       Reps  10-15         Interval Training   Interval Training  No         Treadmill   MPH  1.5       Grade  0.5       Minutes  15       METs  2.25         NuStep   Level  3       Minutes  15       METs  2.4         Biostep-RELP   Level  2       Minutes  15       METs  3         Home Exercise Plan   Plans to continue exercise at  Home (comment) walking and nustep        Frequency  Add 3 additional days to program exercise sessions.       Initial Home Exercises Provided  12/10/17          Exercise Comments: Exercise Comments    Row Name 11/26/17 3875 12/10/17 0827         Exercise Comments  First full day of exercise!  Patient was oriented to gym and equipment including functions, settings, policies, and procedures.  Patient's individual exercise prescription and treatment plan were reviewed.  All starting workloads were established based on the results of the 6 minute walk test done at initial orientation visit.  The plan for exercise progression was also introduced and progression will be customized based on patient's performance and goals.  Reviewed home exercise with pt today.  Pt plans to walk at home, and use his nustep for exercise.  Reviewed THR, pulse, RPE, sign and symptoms, NTG use, and when to call 911 or MD.  Also discussed weather considerations and indoor options.  Pt voiced  understanding.         Exercise Goals and Review: Exercise Goals    Row Name 11/23/17 1430             Exercise Goals   Increase Physical Activity  Yes       Intervention  Provide advice, education, support and counseling about physical activity/exercise needs.;Develop an individualized exercise prescription for aerobic and resistive training based on initial evaluation findings, risk stratification, comorbidities and participant's personal goals.       Expected Outcomes  Short Term: Attend rehab on a regular basis to increase amount of physical activity.;Long Term: Add in home exercise to make exercise part of routine and to increase amount of physical  activity.;Long Term: Exercising regularly at least 3-5 days a week.       Increase Strength and Stamina  Yes       Intervention  Provide advice, education, support and counseling about physical activity/exercise needs.;Develop an individualized exercise prescription for aerobic and resistive training based on initial evaluation findings, risk stratification, comorbidities and participant's personal goals.       Expected Outcomes  Short Term: Increase workloads from initial exercise prescription for resistance, speed, and METs.;Short Term: Perform resistance training exercises routinely during rehab and add in resistance training at home;Long Term: Improve cardiorespiratory fitness, muscular endurance and strength as measured by increased METs and functional capacity (6MWT)       Able to understand and use rate of perceived exertion (RPE) scale  Yes       Intervention  Provide education and explanation on how to use RPE scale       Expected Outcomes  Short Term: Able to use RPE daily in rehab to express subjective intensity level;Long Term:  Able to use RPE to guide intensity level when exercising independently       Knowledge and understanding of Target Heart Rate Range (THRR)  Yes       Intervention  Provide education and explanation of THRR  including how the numbers were predicted and where they are located for reference       Expected Outcomes  Short Term: Able to use daily as guideline for intensity in rehab;Long Term: Able to use THRR to govern intensity when exercising independently;Short Term: Able to state/look up THRR       Able to check pulse independently  Yes       Intervention  Review the importance of being able to check your own pulse for safety during independent exercise;Provide education and demonstration on how to check pulse in carotid and radial arteries.       Expected Outcomes  Short Term: Able to explain why pulse checking is important during independent exercise;Long Term: Able to check pulse independently and accurately       Understanding of Exercise Prescription  Yes       Intervention  Provide education, explanation, and written materials on patient's individual exercise prescription       Expected Outcomes  Short Term: Able to explain program exercise prescription;Long Term: Able to explain home exercise prescription to exercise independently          Exercise Goals Re-Evaluation : Exercise Goals Re-Evaluation    Row Name 11/26/17 0811 12/01/17 1456 12/03/17 0830 12/10/17 0828 12/15/17 1418     Exercise Goal Re-Evaluation   Exercise Goals Review  Increase Physical Activity;Increase Strength and Stamina;Able to understand and use rate of perceived exertion (RPE) scale;Knowledge and understanding of Target Heart Rate Range (THRR);Understanding of Exercise Prescription  Increase Physical Activity;Increase Strength and Stamina;Understanding of Exercise Prescription  Increase Physical Activity;Increase Strength and Stamina;Understanding of Exercise Prescription  Increase Physical Activity;Increase Strength and Stamina;Understanding of Exercise Prescription  Increase Physical Activity;Increase Strength and Stamina;Understanding of Exercise Prescription   Comments  Reviewed RPE scale, THR and program prescription  with pt today.  Pt voiced understanding and was given a copy of goals to take home.   Todd Barr is off to a good start in rehab.  He has completed his first two days in rehab.  We will continue to monitor his progression.   Todd Barr states that he feels like he is doing okay so far with exercise. He reports feeling stronger already and  is pleased with his progress. He will be having surgery on his left arm September 3, which he doesn't want to let set him back.   Reviewed home exercise with pt today.  Pt plans to walk at home, and use his nustep for exercise.  Reviewed THR, pulse, RPE, sign and symptoms, NTG use, and when to call 911 or MD.  Also discussed weather considerations and indoor options.  Pt voiced understanding.  Todd Barr has been doing well in rehab.  He has moved up on the NuStep to level 3.  We will continue to monitor his progress.    Expected Outcomes  Short: Use RPE daily to regulate intensity. Long: Follow program prescription in THR.  Short: Continue to attend rehab regularly.  Long: Continue to follow program prescription.   Short: continue to attend rehab regularly. Long: become independent on the exercise equipment.   Short: add an additonal 3 days of exercise at home Long: increase overall activity level   Short: Increase workload on treadmill.  Long: Continue to improve strength and stamina.    [place] Name 12/28/17 1623 01/11/18 1520 01/25/18 1552 02/02/18 1201 02/23/18 1320     Exercise Goal Re-Evaluation   Exercise Goals Review  -  -  -  -  Increase Physical Activity;Increase Strength and Stamina;Understanding of Exercise Prescription   Comments  Out since last review  Out since last review  Out since last review  Out since last review  Todd Barr returned today and was able to pick up with his workloads.  We will continue to monitor his progression now that he is back. We will also revisit his home exercise once he gets back into the swing of things.    Expected Outcomes  -  -  -  -  Short: Attend  regularly again.  Long: Work on Education administrator.    [place] Name 03/02/18 0758 03/08/18 1546 03/23/18 1508 04/05/18 1359       Exercise Goal Re-Evaluation   Exercise Goals Review  Increase Physical Activity;Increase Strength and Stamina;Understanding of Exercise Prescription  Increase Physical Activity;Increase Strength and Stamina;Understanding of Exercise Prescription  -  -    Comments  Todd Barr is enjoying being back in class. He is using a recumbent bike at home for 20 minutes 2 days per week. Todd Barr was encouraged to add one more day of home exercise and to exercise for 30 minutes.   Todd Barr has been doing well in rehab since returning.  He should be ready to start to increase his workloads again.  We will continue to monitor his progress.   Out since last review  Out since last review    Expected Outcomes  Short: Continue coming to class regularly. Long: Add one more day of home exercise and increase time to 30 minutes. Work on Education administrator.  Short: Begin to increase workloads.  Long: Continue to add in more exercise at home.   -  -       Discharge Exercise Prescription (Final Exercise Prescription Changes): Exercise Prescription Changes - 03/08/18 1500      Response to Exercise   Blood Pressure (Admit)  152/70    Blood Pressure (Exercise)  132/72    Blood Pressure (Exit)  142/70    Heart Rate (Admit)  78 bpm    Heart Rate (Exercise)  113 bpm    Heart Rate (Exit)  90 bpm    Rating of Perceived Exertion (Exercise)  15    Symptoms  none    Duration  Continue with 30 min of aerobic exercise without signs/symptoms of physical distress.    Intensity  THRR unchanged      Progression   Progression  Continue to progress workloads to maintain intensity without signs/symptoms of physical distress.    Average METs  2.55      Resistance Training   Training Prescription  Yes    Weight  3 lbs    Reps  10-15      Interval Training   Interval Training  No       Treadmill   MPH  1.5    Grade  0.5    Minutes  15    METs  2.25      NuStep   Level  3    Minutes  15    METs  2.4      Biostep-RELP   Level  2    Minutes  15    METs  3      Home Exercise Plan   Plans to continue exercise at  Home (comment)   walking and nustep    Frequency  Add 3 additional days to program exercise sessions.    Initial Home Exercises Provided  12/10/17       Nutrition:  Target Goals: Understanding of nutrition guidelines, daily intake of sodium <1572m, cholesterol <2015m calories 30% from fat and 7% or less from saturated fats, daily to have 5 or more servings of fruits and vegetables.  Biometrics: Pre Biometrics - 11/23/17 1431      Pre Biometrics   Height  5' 9.4" (1.763 m)    Weight  205 lb 4.8 oz (93.1 kg)    Waist Circumference  42.5 inches    Hip Circumference  41.5 inches    Waist to Hip Ratio  1.02 %    BMI (Calculated)  29.96    Single Leg Stand  14.05 seconds        Nutrition Therapy Plan and Nutrition Goals: Nutrition Therapy & Goals - 11/26/17 0843      Nutrition Therapy   Diet  DM    Drug/Food Interactions  Statins/Certain Fruits    Protein (specify units)  12oz    Fiber  30 grams    Whole Grain Foods  3 servings   eats some whole grains, likes sourdough bread   Saturated Fats  15 max. grams    Fruits and Vegetables  5 servings/day   8 ideal, eats 2 meals/day typically   Sodium  1500 grams      Personal Nutrition Goals   Nutrition Goal  Become more familiar with foods high in potassium and phosphorus. Limit or avoid these foods d/t dialysis treatments   handouts provided which listed foods high in potassium and phosphorus   Personal Goal #2  Eat on a consistent schedule each day, trying not to skip meals or go long periods of time without eating in order to best manage BG levels. Keeping your new schedule of adding breakfast daily would be ideal    Personal Goal #3  Continue to limit sodium intake, paying particular  attention to canned items and pre-prepared meals. Upper limit range for sodium on a frozen/ pre-prepared meal is 500-60041mer meal    Comments  He monitors sodium and total fluid intake d/t dialysis treatments. He also avoids some foods with potassium and phosphorus, in addition to being on medications which limit absorption. He does not follow a diabetic diet but  does try to limit his sugar intake and keep carbs "lower"      Intervention Plan   Intervention  Nutrition handout(s) given to patient.;Prescribe, educate and counsel regarding individualized specific dietary modifications aiming towards targeted core components such as weight, hypertension, lipid management, diabetes, heart failure and other comorbidities.   Potssium & sodium content of foods, Controlling your phosphorus    Expected Outcomes  Short Term Goal: Understand basic principles of dietary content, such as calories, fat, sodium, cholesterol and nutrients.;Short Term Goal: A plan has been developed with personal nutrition goals set during dietitian appointment.;Long Term Goal: Adherence to prescribed nutrition plan.       Nutrition Assessments: Nutrition Assessments - 11/23/17 1254      MEDFICTS Scores   Pre Score  52       Nutrition Goals Re-Evaluation: Nutrition Goals Re-Evaluation    Row Name 11/26/17 0909 12/03/17 0836 02/23/18 0843         Goals   Current Weight  -  207 lb (93.9 kg)  -     Nutrition Goal  Become more familiar with foods high in potassium and phosphorus. Limit or avoid these foods d/t dialysis treatments  Still working on becoming familiar with food that contain potassium.   Become more familar with foods high in potassium and phosphorus; eat on a consistent schedule each day; continue to limit sodium intake     Comment  He is on dialysis and monitors potassium and phosphorus in foods to a certain extent but could use a more in-depth review of foods and beverages that contain these two micronutrients   Todd Barr says he's been watchig out for foods that are high in potassium for some time now -- says his bllod levels were great the last time he had dialysis.   He states that per dialysis clinic his phosphorus and potassium labs have both been within range. He has been instructed to limit fluids slightly. He has been eating on a consistent schedule and is not skipping meals. He is regaining the 15-20# he lost during previous hospital stay though he would prefer not to gain all the weight back and maintain at a lower body weight     Expected Outcome  He will avoid most foods high in potassium and phosphorus, and limit foods moderate in these micronutrients  Short: become more aware of foods high in potassium Long: avoid foods high in potassium   He will pay close attention to phosphorus and potassium lab values, limit fluids and sodium as instructed, and continue to eat on a consistent schedule as important for dialysis and to manage BG levels       Personal Goal #2 Re-Evaluation   Personal Goal #2  Eat on a consistent schedule each day, trying not to skip meals or go long periods of time without eating to better manage BG levels. Keeping your new schedule of adding breakfast daily would be ideal  -  -       Personal Goal #3 Re-Evaluation   Personal Goal #3  Continue to limit sodium intake- paying particular attention to canned items and pre-prepared meals. Upper limit range for sodium on a frozen/ pre-pepared meal is 500-65m per meal  -  -        Nutrition Goals Discharge (Final Nutrition Goals Re-Evaluation): Nutrition Goals Re-Evaluation - 02/23/18 0843      Goals   Nutrition Goal  Become more familar with foods high in potassium and phosphorus; eat on a  consistent schedule each day; continue to limit sodium intake    Comment  He states that per dialysis clinic his phosphorus and potassium labs have both been within range. He has been instructed to limit fluids slightly. He has been eating on a  consistent schedule and is not skipping meals. He is regaining the 15-20# he lost during previous hospital stay though he would prefer not to gain all the weight back and maintain at a lower body weight    Expected Outcome  He will pay close attention to phosphorus and potassium lab values, limit fluids and sodium as instructed, and continue to eat on a consistent schedule as important for dialysis and to manage BG levels       Psychosocial: Target Goals: Acknowledge presence or absence of significant depression and/or stress, maximize coping skills, provide positive support system. Participant is able to verbalize types and ability to use techniques and skills needed for reducing stress and depression.   Initial Review & Psychosocial Screening: Initial Psych Review & Screening - 11/23/17 1252      Initial Review   Current issues with  Current Stress Concerns    Source of Stress Concerns  Chronic Illness;Unable to perform yard/household activities    Comments  Maciej has just been placed on Dialysis (T, Th, Sa) this past month. He is supposed to go for a fistula in October. This has really been hard. He reports a lot of his issues he does not know if it is coming from his recent heart attack or his failing kindeys.       Family Dynamics   Good Support System?  Yes   spouse     Screening Interventions   Interventions  Encouraged to exercise;Program counselor consult;To provide support and resources with identified psychosocial needs;Provide feedback about the scores to participant    Expected Outcomes  Short Term goal: Utilizing psychosocial counselor, staff and physician to assist with identification of specific Stressors or current issues interfering with healing process. Setting desired goal for each stressor or current issue identified.;Long Term Goal: Stressors or current issues are controlled or eliminated.;Short Term goal: Identification and review with participant of any Quality of Life  or Depression concerns found by scoring the questionnaire.;Long Term goal: The participant improves quality of Life and PHQ9 Scores as seen by post scores and/or verbalization of changes       Quality of Life Scores:  Quality of Life - 11/23/17 1253      Quality of Life   Select  Quality of Life      Quality of Life Scores   Health/Function Pre  19.33 %    Socioeconomic Pre  20.63 %    Psych/Spiritual Pre  24.29 %    Family Pre  30 %    GLOBAL Pre  22.14 %      Scores of 19 and below usually indicate a poorer quality of life in these areas.  A difference of  2-3 points is a clinically meaningful difference.  A difference of 2-3 points in the total score of the Quality of Life Index has been associated with significant improvement in overall quality of life, self-image, physical symptoms, and general health in studies assessing change in quality of life.  PHQ-9: Recent Review Flowsheet Data    Depression screen [facility] 2/9 11/23/2017   Decreased Interest 0   Down, Depressed, Hopeless 0   PHQ - 2 Score 0   Altered sleeping 0   Tired, decreased energy 2  Change in appetite 0   Feeling bad or failure about yourself  0   Trouble concentrating 0   Moving slowly or fidgety/restless 0   Suicidal thoughts 0   PHQ-9 Score 2   Difficult doing work/chores Somewhat difficult     Interpretation of Total Score  Total Score Depression Severity:  1-4 = Minimal depression, 5-9 = Mild depression, 10-14 = Moderate depression, 15-19 = Moderately severe depression, 20-27 = Severe depression   Psychosocial Evaluation and Intervention: Psychosocial Evaluation - 12/10/17 1002      Psychosocial Evaluation & Interventions   Interventions  Encouraged to exercise with the program and follow exercise prescription    Comments  Counselor met with Mr. Teel today for initial psychosocial evaluation.  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  Todd Barr has a strong support system with a spouse of 7 years; a son in [place] and active involvement in his local church community.  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  Todd Barr sleeps well with a CPAP and has a good appetite.  He denies a history of depression or anxiety or any current symptoms and states he is typically in a positive mood.  Todd Barr's health is reportedly his primary stressor at this time.  He has goals to increase his stamina while in this program.  Staff will follow with him.    Expected Outcomes  Short:  Todd Barr will exercise consistently to increase his stamina and for his mental health as a positive coping strategy for stress.   Long:  Todd Barr will develop a positive and healthy lifestyle routine of exercise and diet and stress management..    Continue Psychosocial Services   Follow up required by staff       Psychosocial Re-Evaluation: Psychosocial Re-Evaluation    [place] Name 12/03/17 [phone] 03/02/18 0804           Psychosocial Re-Evaluation   Current issues with  None Identified  Current Stress Concerns      Comments  Todd Barr is in good spirits. He has no trouble sleeping, he uses a CPAP and has an inclined bed that helps.   Todd Barr reports being happy and sleeping well. He sleeps with his CPAP and jokes that his sleep is only interrupted when he has to come to Cardiac Rehab early. Todd Barr's upcoming procedure on 03/09/2018 is a stressor. He is hopeful to have home dyalisis so he sees this procedure as a positive opportunity. He will hear results on 03/18/2018 and how his fistula is healing.      Expected Outcomes  Short: continue to be in good spirits no matter what comes his way Long: stay stress free    Short: Although he has health concerns, he has a great outlook, continue with positive attitude. Long: Happy where he is, just keep it up.         Psychosocial Discharge (Final Psychosocial Re-Evaluation): Psychosocial Re-Evaluation - 03/02/18  0804      Psychosocial Re-Evaluation   Current issues with  Current Stress Concerns    Comments  Todd Barr reports being happy and sleeping well. He sleeps with his CPAP and jokes that his sleep is only interrupted when he has to come to Cardiac Rehab early. Todd Barr's upcoming procedure on 03/09/2018 is a stressor. He is hopeful to have home dyalisis so he sees this procedure as a  positive opportunity. He will hear results on 03/18/2018 and how his fistula is healing.    Expected Outcomes  Short: Although he has health concerns, he has a great outlook, continue with positive attitude. Long: Happy where he is, just keep it up.       Vocational Rehabilitation: Provide vocational rehab assistance to qualifying candidates.   Vocational Rehab Evaluation & Intervention: Vocational Rehab - 11/23/17 1255      Initial Vocational Rehab Evaluation & Intervention   Assessment shows need for Vocational Rehabilitation  No       Education: Education Goals: Education classes will be provided on a variety of topics geared toward better understanding of heart health and risk factor modification. Participant will state understanding/return demonstration of topics presented as noted by education test scores.  Learning Barriers/Preferences: Learning Barriers/Preferences - 11/23/17 1254      Learning Barriers/Preferences   Learning Barriers  None    Learning Preferences  Individual Instruction;Written Material       Education Topics:  AED/CPR: - Group verbal and written instruction with the use of models to demonstrate the basic use of the AED with the basic ABC's of resuscitation.   General Nutrition Guidelines/Fats and Fiber: -Group instruction provided by verbal, written material, models and posters to present the general guidelines for heart healthy nutrition. Gives an explanation and review of dietary fats and fiber.   Controlling Sodium/Reading Food Labels: -Group verbal and written material  supporting the discussion of sodium use in heart healthy nutrition. Review and explanation with models, verbal and written materials for utilization of the food label.   Exercise Physiology & General Exercise Guidelines: - Group verbal and written instruction with models to review the exercise physiology of the cardiovascular system and associated critical values. Provides general exercise guidelines with specific guidelines to those with heart or lung disease.    Cardiac Rehab from 03/08/2018 in [facility] Cardiac and Pulmonary Rehab  Date  03/02/18  Educator  [facility]  Instruction Review Code  1- Verbalizes Understanding      Aerobic Exercise & Resistance Training: - Gives group verbal and written instruction on the various components of exercise. Focuses on aerobic and resistive training programs and the benefits of this training and how to safely progress through these programs..   Cardiac Rehab from 03/08/2018 in [facility] Cardiac and Pulmonary Rehab  Date  03/04/18  Educator  [facility]  Instruction Review Code  1- Verbalizes Understanding      Flexibility, Balance, Mind/Body Relaxation: Provides group verbal/written instruction on the benefits of flexibility and balance training, including mind/body exercise modes such as yoga, pilates and tai chi.  Demonstration and skill practice provided.   Cardiac Rehab from 03/08/2018 in [facility] Cardiac and Pulmonary Rehab  Date  03/08/18  Educator  [facility]  Instruction Review Code  1- Verbalizes Understanding      Stress and Anxiety: - Provides group verbal and written instruction about the health risks of elevated stress and causes of high stress.  Discuss the correlation between heart/lung disease and anxiety and treatment options. Review healthy ways to manage with stress and anxiety.   Cardiac Rehab from 03/08/2018 in [facility] Cardiac and Pulmonary Rehab  Date  12/01/17  Educator  [facility]  Instruction Review Code  1- Verbalizes Understanding      Depression: -  Provides group verbal and written instruction on the correlation between heart/lung disease and depressed mood, treatment options, and the stigmas associated with seeking treatment.   Anatomy & Physiology of the Heart: -  Group verbal and written instruction and models provide basic cardiac anatomy and physiology, with the coronary electrical and arterial systems. Review of Valvular disease and Heart Failure   Cardiac Rehab from 03/08/2018 in [facility] Cardiac and Pulmonary Rehab  Date  12/03/17  Educator  CE  Instruction Review Code  1- Verbalizes Understanding      Cardiac Procedures: - Group verbal and written instruction to review commonly prescribed medications for heart disease. Reviews the medication, class of the drug, and side effects. Includes the steps to properly store meds and maintain the prescription regimen. (beta blockers and nitrates)   Cardiac Medications I: - Group verbal and written instruction to review commonly prescribed medications for heart disease. Reviews the medication, class of the drug, and side effects. Includes the steps to properly store meds and maintain the prescription regimen.   Cardiac Medications II: -Group verbal and written instruction to review commonly prescribed medications for heart disease. Reviews the medication, class of the drug, and side effects. (all other drug classes)   Cardiac Rehab from 03/08/2018 in [facility] Cardiac and Pulmonary Rehab  Date  11/26/17  Educator  CE  Instruction Review Code  1- Verbalizes Understanding       Go Sex-Intimacy & Heart Disease, Get SMART - Goal Setting: - Group verbal and written instruction through game format to discuss heart disease and the return to sexual intimacy. Provides group verbal and written material to discuss and apply goal setting through the application of the S.M.A.R.T. Method.   Other Matters of the Heart: - Provides group verbal, written materials and models to describe Stable Angina and  Peripheral Artery. Includes description of the disease process and treatment options available to the cardiac patient.   Cardiac Rehab from 03/08/2018 in [facility] Cardiac and Pulmonary Rehab  Date  12/03/17  Educator  CE  Instruction Review Code  1- Verbalizes Understanding      Exercise & Equipment Safety: - Individual verbal instruction and demonstration of equipment use and safety with use of the equipment.   Cardiac Rehab from 03/08/2018 in [facility] Cardiac and Pulmonary Rehab  Date  11/23/17  Educator  [facility]  Instruction Review Code  1- Verbalizes Understanding      Infection Prevention: - Provides verbal and written material to individual with discussion of infection control including proper hand washing and proper equipment cleaning during exercise session.   Cardiac Rehab from 03/08/2018 in [facility] Cardiac and Pulmonary Rehab  Date  11/23/17  Educator  [facility]  Instruction Review Code  1- Verbalizes Understanding      Falls Prevention: - Provides verbal and written material to individual with discussion of falls prevention and safety.   Cardiac Rehab from 03/08/2018 in [facility] Cardiac and Pulmonary Rehab  Date  11/23/17  Educator  [facility]  Instruction Review Code  1- Verbalizes Understanding      Diabetes: - Individual verbal and written instruction to review signs/symptoms of diabetes, desired ranges of glucose level fasting, after meals and with exercise. Acknowledge that pre and post exercise glucose checks will be done for 3 sessions at entry of program.   Cardiac Rehab from 03/08/2018 in [facility] Cardiac and Pulmonary Rehab  Date  11/23/17  Educator  [facility]  Instruction Review Code  1- Verbalizes Understanding      Know Your Numbers and Risk Factors: -Group verbal and written instruction about important numbers in your health.  Discussion of what are risk factors and how they play a role in the disease process.  Review of  Cholesterol, Blood Pressure, Diabetes, and BMI and the role they play in  your overall health.   Cardiac Rehab from 03/08/2018 in [facility] Cardiac and Pulmonary Rehab  Date  11/26/17  Educator  CE  Instruction Review Code  1- Verbalizes Understanding      Sleep Hygiene: -Provides group verbal and written instruction about how sleep can affect your health.  Define sleep hygiene, discuss sleep cycles and impact of sleep habits. Review good sleep hygiene tips.    Cardiac Rehab from 03/08/2018 in [facility] Cardiac and Pulmonary Rehab  Date  02/23/18  Educator  [facility]  Instruction Review Code  1- Verbalizes Understanding      Other: -Provides group and verbal instruction on various topics (see comments)   Knowledge Questionnaire Score: Knowledge Questionnaire Score - 11/23/17 1254      Knowledge Questionnaire Score   Pre Score  25/26   correct answers reviewed with patient, focus on exercise      Core Components/Risk Factors/Patient Goals at Admission: Personal Goals and Risk Factors at Admission - 11/23/17 1255      Core Components/Risk Factors/Patient Goals on Admission    Weight Management  Yes;Weight Loss    Intervention  Weight Management: Develop a combined nutrition and exercise program designed to reach desired caloric intake, while maintaining appropriate intake of nutrient and fiber, sodium and fats, and appropriate energy expenditure required for the weight goal.;Weight Management: Provide education and appropriate resources to help participant work on and attain dietary goals.;Weight Management/Obesity: Establish reasonable short term and long term weight goals.    Admit Weight  205 lb (93 kg)    Goal Weight: Short Term  200 lb (90.7 kg)    Goal Weight: Long Term  200 lb (90.7 kg)    Expected Outcomes  Short Term: Continue to assess and modify interventions until short term weight is achieved;Long Term: Adherence to nutrition and physical activity/exercise program aimed toward attainment of established weight goal;Weight Loss: Understanding of general  recommendations for a balanced deficit meal plan, which promotes 1-2 lb weight loss per week and includes a negative energy balance of [phone] kcal/d;Understanding recommendations for meals to include 15-35% energy as protein, 25-35% energy from fat, 35-60% energy from carbohydrates, less than 237m of dietary cholesterol, 20-35 gm of total fiber daily;Understanding of distribution of calorie intake throughout the day with the consumption of 4-5 meals/snacks    Diabetes  Yes    Intervention  Provide education about signs/symptoms and action to take for hypo/hyperglycemia.;Provide education about proper nutrition, including hydration, and aerobic/resistive exercise prescription along with prescribed medications to achieve blood glucose in normal ranges: Fasting glucose 65-99 mg/dL    Expected Outcomes  Short Term: Participant verbalizes understanding of the signs/symptoms and immediate care of hyper/hypoglycemia, proper foot care and importance of medication, aerobic/resistive exercise and nutrition plan for blood glucose control.;Long Term: Attainment of HbA1C < 7%.    Hypertension  Yes    Intervention  Monitor prescription use compliance.;Provide education on lifestyle modifcations including regular physical activity/exercise, weight management, moderate sodium restriction and increased consumption of fresh fruit, vegetables, and low fat dairy, alcohol moderation, and smoking cessation.    Expected Outcomes  Short Term: Continued assessment and intervention until BP is < 140/937mHG in hypertensive participants. < 130/8047mG in hypertensive participants with diabetes, heart failure or chronic kidney disease.;Long Term: Maintenance of blood pressure at goal levels.    Lipids  Yes    Intervention  Provide education and support for participant on nutrition &  aerobic/resistive exercise along with prescribed medications to achieve LDL <36m, HDL >474m    Expected Outcomes  Short Term: Participant states  understanding of desired cholesterol values and is compliant with medications prescribed. Participant is following exercise prescription and nutrition guidelines.;Long Term: Cholesterol controlled with medications as prescribed, with individualized exercise RX and with personalized nutrition plan. Value goals: LDL < 7026mHDL > 40 mg.       Core Components/Risk Factors/Patient Goals Review:  Goals and Risk Factor Review    [place] Name 12/03/17 0833149/19/19 0807           Core Components/Risk Factors/Patient Goals Review   Personal Goals Review  Weight Management/Obesity;Diabetes;Hypertension  Weight Management/Obesity;Diabetes;Hypertension      Review  PhiAbbe Amsterdamkes his medications as prescribed and is keeping his diabetes under control. 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 He would like to lose a few pounds to feel better.   PhiAbbe Barr taking his meds as directed and has an upcoming procedure to consider home dialysis which would require a different port (mode). He would like to lose a few pounds but is also happy where he is. Blood glucose 140-180m65m one hour after eating. He is on a sliding scale with insulin so makes adjustments when needed. Blood pressure at home 151/64 before last med change. Dialysis drops blood pressure about 20 to 131/64. Doctor has changed time of day to take blood pressure meds. Monitoring and doctor will adjust meds as needed.       Expected Outcomes  Short: continue to take medications as directed. Long: lose 5 pounds through diet and exercise   Short: Continue to take meds as directed, monitor blood glucose, home blood pressure readings, and use CPAP. Long: lose 5 pounds through diet and exercise.         Core Components/Risk Factors/Patient Goals at Discharge (Final Review):  Goals and Risk Factor Review - 03/02/18 0807      Core Components/Risk Factors/Patient Goals Review   Personal Goals Review  Weight  Management/Obesity;Diabetes;Hypertension    Review  PhilAbbe Amsterdamtaking his meds as directed and has an upcoming procedure to consider home dialysis which would require a different port (mode). He would like to lose a few pounds but is also happy where he is. Blood glucose 140-180mg45mone hour after eating. He is on a sliding scale with insulin so makes adjustments when needed. Blood pressure at home 151/64 before last med change. Dialysis drops blood pressure about 20 to 131/64. Doctor has changed time of day to take blood pressure meds. Monitoring and doctor will adjust meds as needed.     Expected Outcomes  Short: Continue to take meds as directed, monitor blood glucose, home blood pressure readings, and use CPAP. Long: lose 5 pounds through diet and exercise.       ITP Comments: ITP Comments    Row Name 11/23/17 1216 11/25/17 0817 12/10/17 0818 12/22/17 1531 12/23/17 0616   ITP Comments  Med Review completed. Initial ITP created. [facility] 30 day review completed. ITP sent to Dr. Bert Ramonita Labering for Dr. Mark Emily Filbertical Director of Cardiac Rehab. Continue with ITP unless changes are made by physician.  New to program.  Has not started to exercise yet.   Todd Barr let us [place] today that he will be having a fistula placed in his left arm next Tuesday. We talked about making sure he gets clearance  before returning to rehab.  Spoke with wife.  Todd Barr had his fistula placed last week.  He has his follow scheduled for next week.  Reminded her that we will need clearance for him to return.   30 day review completed. ITP sent to Dr. Emily Filbert, Medical Director of Cardiac Rehab. Continue with ITP unless changes are made by physician  Out for medical reason   [place] Name 01/11/18 1520 01/20/18 0750 01/25/18 1359 02/02/18 1201 02/17/18 0549   ITP Comments  Called to check on patient.  He has a follow up appointment from his fistula placement this Wednesday.  He will need  clearance to return to rehab.  He is hoping to be able to come back either this Thursday or next Tuesday.   30 day review completed. ITP sent to Dr. Emily Filbert, Medical Director of Cardiac Rehab. Continue with ITP unless changes are made by physician [facility] for medical reason  Called to check on Todd Barr.  Out since 8/29 for fistula placement.  Was supposed to return last week.  Unable to leave message.  Will give until end of week, then will send letter to discharge.   Called to check on [facility]. [facility] to be cleared to return. Spoke with his wife. He has the appointment date in his phone and will let Todd Barr know when that is.  He will returned once cleared.   30 day review. Continue with ITP unless direccted changes per Medical Director Chart Review.   [place] Name 02/18/18 1315 02/23/18 0815 03/17/18 0553 03/23/18 1503 04/05/18 1358   ITP Comments  Todd Barr called to let Todd Barr know that he was finally cleared to return to rehab.  He has a letter to bring in clearing him.  He hopes to return on Tuesday 11/12.  Todd Barr returned today for first time since fistula was placed.  He did bring back a note releasing him and it was placed in his chart.    30 day review. Continue with ITP unless direccted changes per Medical Director Chart Review.  Called to check on Todd Barr and spoke with his wife.  He has another procedure done on his fistula.  He is also getting an abdominal line placed on 03/29/18.  He will need clearance to return again.  Reminded wife that his current order is only good through 04/28/18.   Todd Barr continues to remain out waiting for clearance.    [place] Name 04/13/18 [phone] 04/20/18 1345         ITP Comments  30 Day Review. Continue with ITP unless directed changes per Medical Director review.  Waiting for clearance to return to program after fistula surgery.  Called to check on Todd Barr.  His next surgery is scheduled for 04/26/2018 and his referral runs out on 04/28/2018.  We will discharge him at this  time and hope to see him back with a new referral.  He completed 18 sessions.          Comments: Discharge ITP

## 2018-11-30 ENCOUNTER — Other Ambulatory Visit: Payer: Self-pay

## 2018-11-30 ENCOUNTER — Emergency Department: Payer: No Typology Code available for payment source

## 2018-11-30 ENCOUNTER — Inpatient Hospital Stay
Admission: EM | Admit: 2018-11-30 | Discharge: 2018-12-02 | DRG: 286 | Disposition: A | Discharge status: Home / Self Care | Payer: No Typology Code available for payment source | Attending: Internal Medicine | Admitting: Internal Medicine

## 2018-11-30 ENCOUNTER — Encounter: Payer: Self-pay | Admitting: Emergency Medicine

## 2018-11-30 DIAGNOSIS — Z992 Dependence on renal dialysis: Secondary | ICD-10-CM

## 2018-11-30 DIAGNOSIS — N186 End stage renal disease: Secondary | ICD-10-CM | POA: Diagnosis present

## 2018-11-30 DIAGNOSIS — Z794 Long term (current) use of insulin: Secondary | ICD-10-CM | POA: Diagnosis not present

## 2018-11-30 DIAGNOSIS — E785 Hyperlipidemia, unspecified: Secondary | ICD-10-CM | POA: Diagnosis present

## 2018-11-30 DIAGNOSIS — I25110 Atherosclerotic heart disease of native coronary artery with unstable angina pectoris: Secondary | ICD-10-CM | POA: Diagnosis present

## 2018-11-30 DIAGNOSIS — E876 Hypokalemia: Secondary | ICD-10-CM | POA: Diagnosis present

## 2018-11-30 DIAGNOSIS — Z7982 Long term (current) use of aspirin: Secondary | ICD-10-CM

## 2018-11-30 DIAGNOSIS — Z955 Presence of coronary angioplasty implant and graft: Secondary | ICD-10-CM | POA: Diagnosis not present

## 2018-11-30 DIAGNOSIS — K219 Gastro-esophageal reflux disease without esophagitis: Secondary | ICD-10-CM | POA: Diagnosis present

## 2018-11-30 DIAGNOSIS — I252 Old myocardial infarction: Secondary | ICD-10-CM

## 2018-11-30 DIAGNOSIS — N2581 Secondary hyperparathyroidism of renal origin: Secondary | ICD-10-CM | POA: Diagnosis present

## 2018-11-30 DIAGNOSIS — Z7902 Long term (current) use of antithrombotics/antiplatelets: Secondary | ICD-10-CM

## 2018-11-30 DIAGNOSIS — Z20828 Contact with and (suspected) exposure to other viral communicable diseases: Secondary | ICD-10-CM | POA: Diagnosis present

## 2018-11-30 DIAGNOSIS — I120 Hypertensive chronic kidney disease with stage 5 chronic kidney disease or end stage renal disease: Secondary | ICD-10-CM | POA: Diagnosis present

## 2018-11-30 DIAGNOSIS — Z8249 Family history of ischemic heart disease and other diseases of the circulatory system: Secondary | ICD-10-CM | POA: Diagnosis not present

## 2018-11-30 DIAGNOSIS — E1122 Type 2 diabetes mellitus with diabetic chronic kidney disease: Secondary | ICD-10-CM | POA: Diagnosis present

## 2018-11-30 DIAGNOSIS — Z79899 Other long term (current) drug therapy: Secondary | ICD-10-CM

## 2018-11-30 DIAGNOSIS — I200 Unstable angina: Secondary | ICD-10-CM | POA: Diagnosis present

## 2018-11-30 DIAGNOSIS — D631 Anemia in chronic kidney disease: Secondary | ICD-10-CM | POA: Diagnosis present

## 2018-11-30 DIAGNOSIS — I214 Non-ST elevation (NSTEMI) myocardial infarction: Secondary | ICD-10-CM | POA: Diagnosis present

## 2018-11-30 HISTORY — DX: End stage renal disease: N18.6

## 2018-11-30 HISTORY — DX: Atherosclerotic heart disease of native coronary artery without angina pectoris: I25.10

## 2018-11-30 HISTORY — DX: Type 2 diabetes mellitus without complications: E11.9

## 2018-11-30 HISTORY — DX: Acute myocardial infarction, unspecified: I21.9

## 2018-11-30 HISTORY — DX: Essential (primary) hypertension: I10

## 2018-11-30 HISTORY — DX: Hyperlipidemia, unspecified: E78.5

## 2018-11-30 HISTORY — DX: Disorder of kidney and ureter, unspecified: N28.9

## 2018-11-30 LAB — BASIC METABOLIC PANEL
Anion gap: 13 (ref 5–15)
BUN: 62 mg/dL — ABNORMAL HIGH (ref 8–23)
CO2: 24 mmol/L (ref 22–32)
Calcium: 8.1 mg/dL — ABNORMAL LOW (ref 8.9–10.3)
Chloride: 98 mmol/L (ref 98–111)
Creatinine, Ser: 7.43 mg/dL — ABNORMAL HIGH (ref 0.61–1.24)
GFR calc Af Amer: 8 mL/min — ABNORMAL LOW (ref >60)
GFR calc non Af Amer: 7 mL/min — ABNORMAL LOW (ref >60)
Glucose, Bld: 169 mg/dL — ABNORMAL HIGH (ref 70–99)
Potassium: 3 mmol/L — ABNORMAL LOW (ref 3.5–5.1)
Sodium: 135 mmol/L (ref 135–145)

## 2018-11-30 LAB — CBC
HCT: 24.4 % — ABNORMAL LOW (ref 39.0–52.0)
Hemoglobin: 8.6 g/dL — ABNORMAL LOW (ref 13.0–17.0)
MCH: 32.2 pg (ref 26.0–34.0)
MCHC: 35.2 g/dL (ref 30.0–36.0)
MCV: 91.4 fL (ref 80.0–100.0)
Platelets: 136 10*3/uL — ABNORMAL LOW (ref 150–400)
RBC: 2.67 MIL/uL — ABNORMAL LOW (ref 4.22–5.81)
RDW: 13.2 % (ref 11.5–15.5)
WBC: 8.3 10*3/uL (ref 4.0–10.5)
nRBC: 0 % (ref 0.0–0.2)

## 2018-11-30 LAB — GLUCOSE, CAPILLARY
Glucose-Capillary: 68 mg/dL — ABNORMAL LOW (ref 70–99)
Glucose-Capillary: 91 mg/dL (ref 70–99)

## 2018-11-30 LAB — PROTIME-INR
INR: 1.2 (ref 0.8–1.2)
Prothrombin Time: 14.8 seconds (ref 11.4–15.2)

## 2018-11-30 LAB — SARS CORONAVIRUS 2 BY RT PCR (HOSPITAL ORDER, PERFORMED IN ~~LOC~~ HOSPITAL LAB): SARS Coronavirus 2: NEGATIVE

## 2018-11-30 LAB — HEMOGLOBIN A1C
Hgb A1c MFr Bld: 8.3 % — ABNORMAL HIGH (ref 4.8–5.6)
Mean Plasma Glucose: 191.51 mg/dL

## 2018-11-30 LAB — TROPONIN I (HIGH SENSITIVITY)
Troponin I (High Sensitivity): 21 ng/L — ABNORMAL HIGH (ref <18)
Troponin I (High Sensitivity): 20 ng/L — ABNORMAL HIGH (ref <18)

## 2018-11-30 LAB — APTT: aPTT: 37 seconds — ABNORMAL HIGH (ref 24–36)

## 2018-11-30 IMAGING — US VENOUS DOPPLER ULTRASOUND OF LEFT LOWER EXTREMITY
1 series · 13 of 24 positions shown · non-contrast
Comparison: None.

CLINICAL DATA: 73-year-old male with heaviness and swelling of the
left leg.



[Series 1: venous doppler ultrasound of left lower extremity · 0.09mm/px · 13 of 34 slices shown]
[im 1/34]
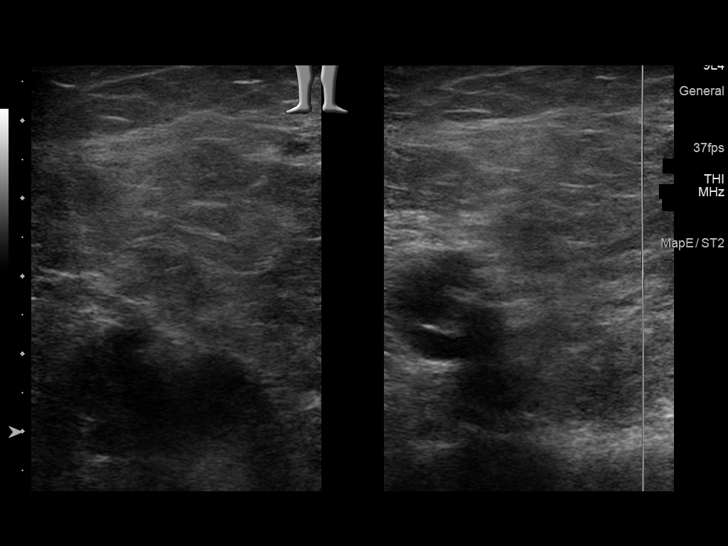
[im 3/34]
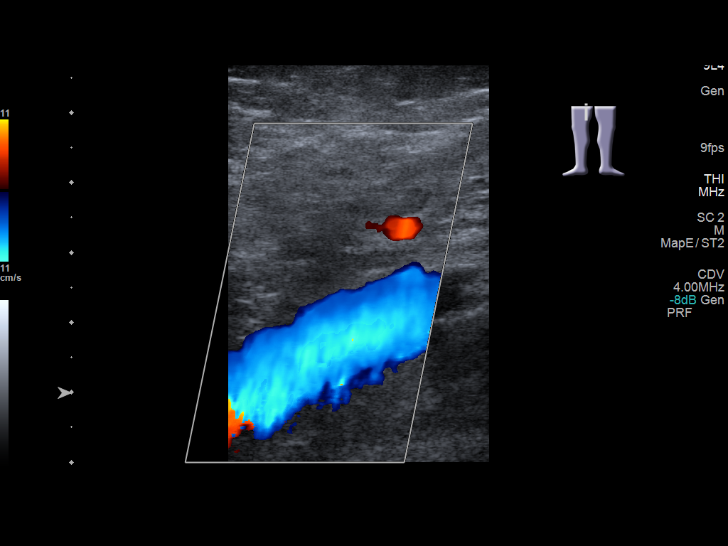
[im 6/34]
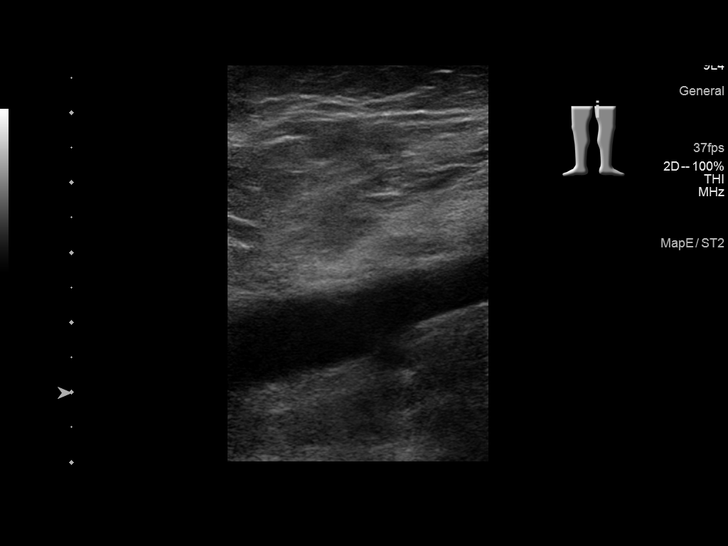
[im 9/34]
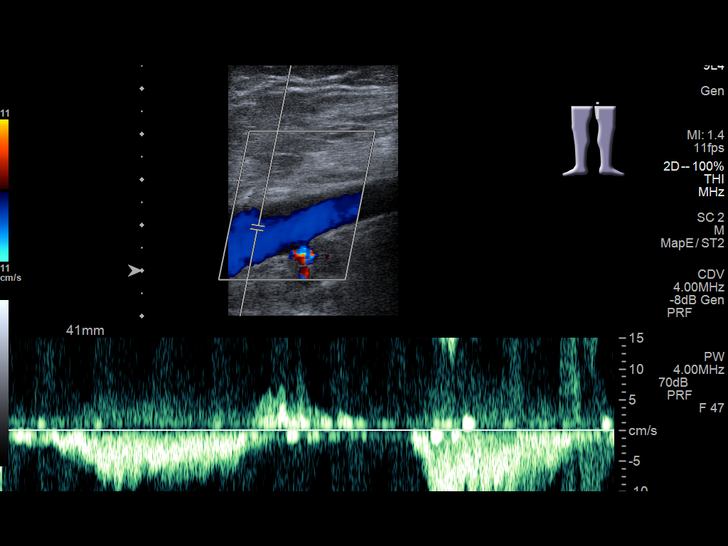
[im 12/34]
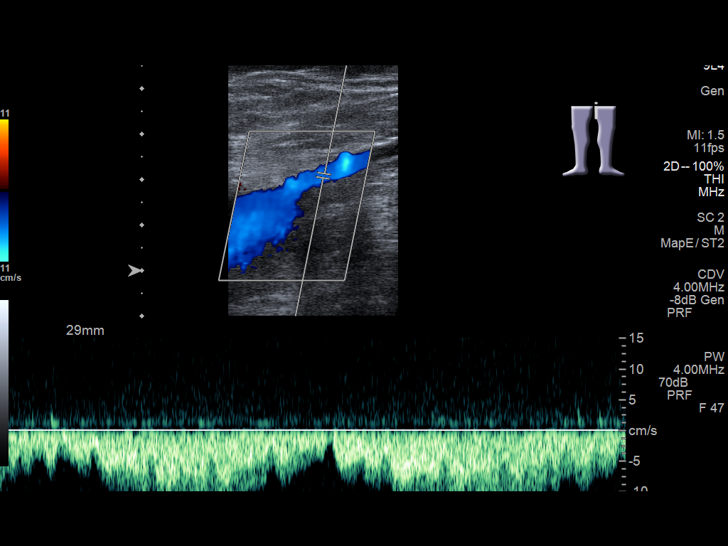
[im 15/34]
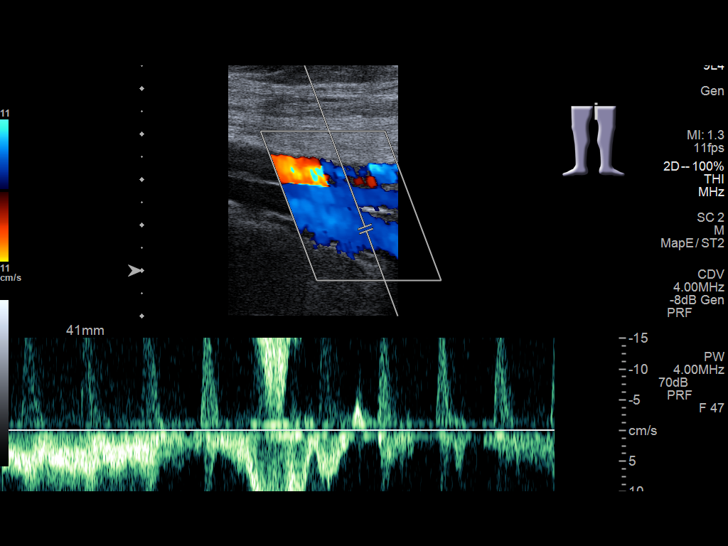
[im 18/34]
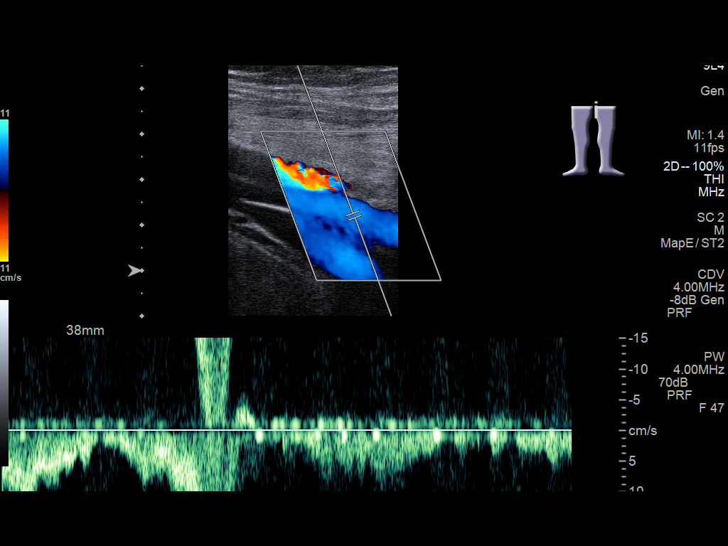
[im 19/34]
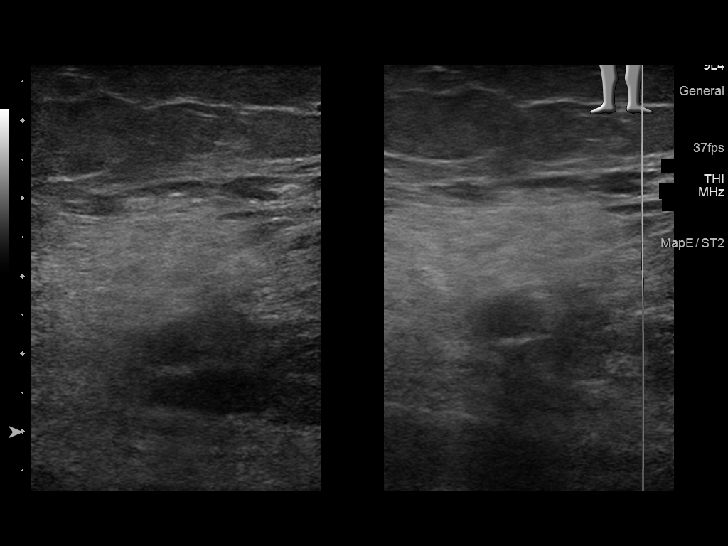
[im 22/34]
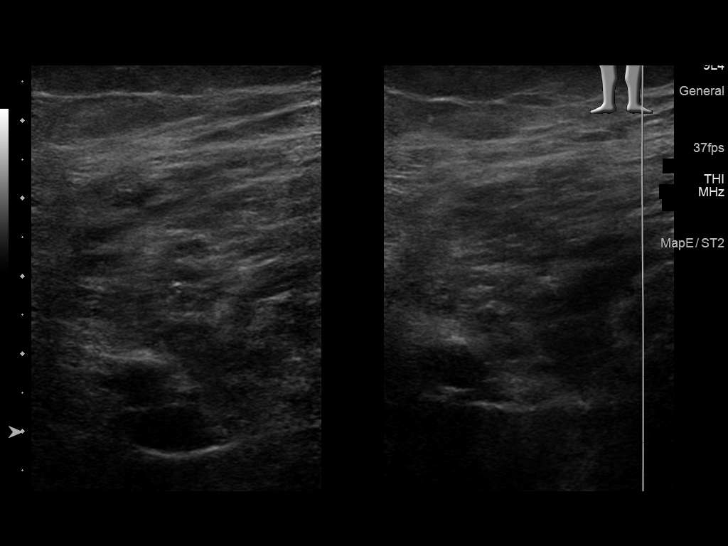
[im 25/34]
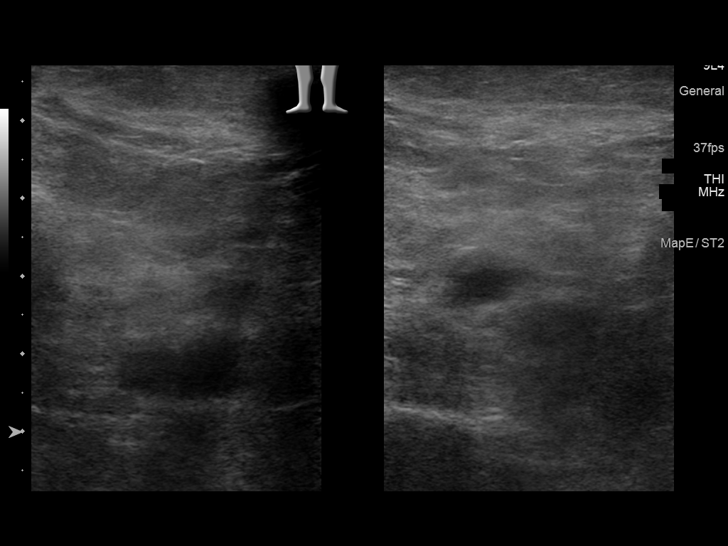
[im 28/34]
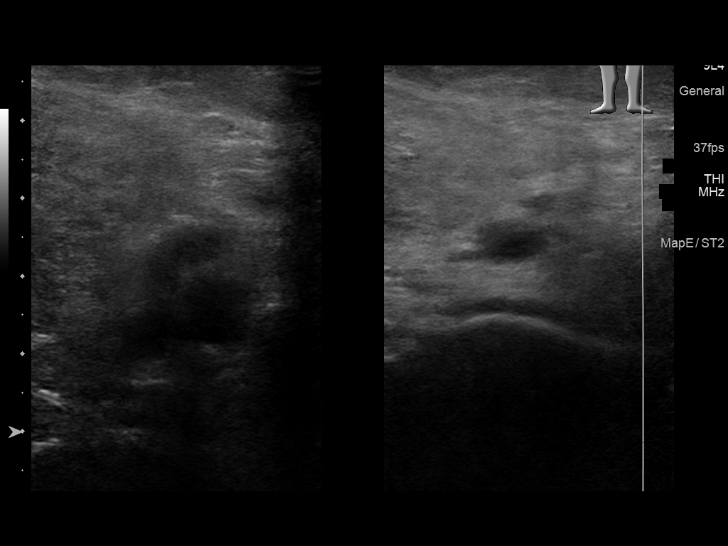
[im 31/34]
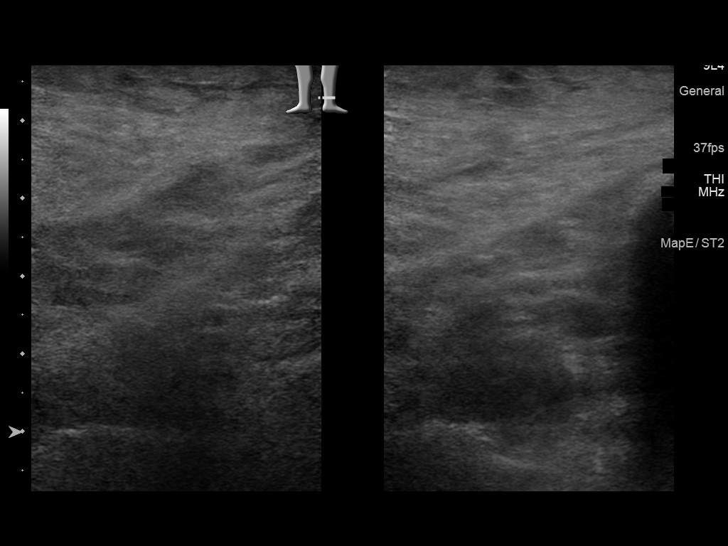
[im 34/34]
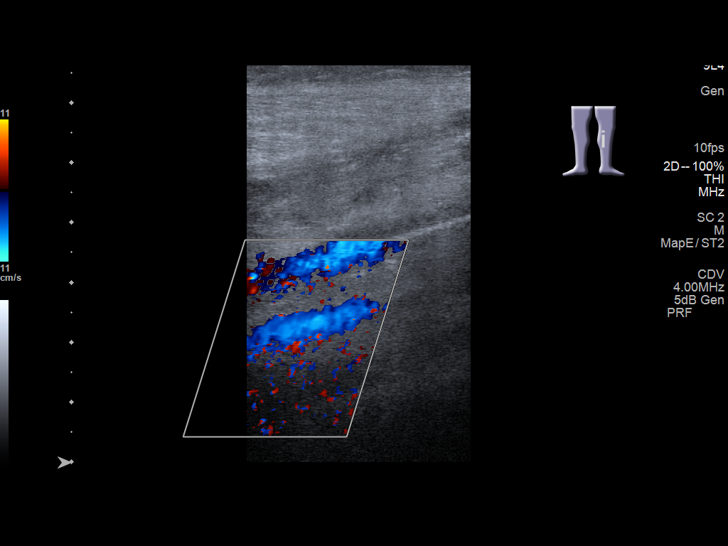

[13 of 24 positions shown; findings below may reference images not displayed]

FINDINGS: Contralateral Common Femoral Vein: Respiratory phasicity is normal
and symmetric with the symptomatic side. No evidence of thrombus.
Normal compressibility.

Common Femoral Vein: No evidence of thrombus. Normal
compressibility, respiratory phasicity and response to augmentation.

Saphenofemoral Junction: No evidence of thrombus. Normal
compressibility and flow on color Doppler imaging.

Profunda Femoral Vein: No evidence of thrombus. Normal
compressibility and flow on color Doppler imaging.

Femoral Vein: No evidence of thrombus. Normal compressibility,
respiratory phasicity and response to augmentation.

Popliteal Vein: No evidence of thrombus. Normal compressibility,
respiratory phasicity and response to augmentation.

Calf Veins: No evidence of thrombus. Normal compressibility and flow
on color Doppler imaging.

Superficial Great Saphenous Vein: No evidence of thrombus. Normal
compressibility.

Venous Reflux:  None.

Other Findings:  None.
IMPRESSION: No evidence of deep venous thrombosis.

## 2018-11-30 MED ORDER — DELFLEX-LC/2.5% DEXTROSE 394 MOSM/L IP SOLN
INTRAPERITONEAL | Status: DC
  Filled 2018-11-30 (×2): qty 3000

## 2018-11-30 MED ORDER — INSULIN GLARGINE 100 UNIT/ML ~~LOC~~ SOLN: 15.0000 [IU] | Freq: Every day | SUBCUTANEOUS | Status: DC

## 2018-11-30 MED ORDER — HEPARIN BOLUS VIA INFUSION
4000.0000 [IU] | Freq: Once | INTRAVENOUS | Status: AC
  Administered 2018-11-30: 4000 [IU] via INTRAVENOUS
  Filled 2018-11-30: qty 4000

## 2018-11-30 MED ORDER — INSULIN ASPART 100 UNIT/ML ~~LOC~~ SOLN
0.0000 [IU] | Freq: Three times a day (TID) | SUBCUTANEOUS | Status: DC
  Administered 2018-12-01: 3 [IU] via SUBCUTANEOUS
  Administered 2018-12-02: 5 [IU] via SUBCUTANEOUS
  Administered 2018-12-02: 7 [IU] via SUBCUTANEOUS
  Filled 2018-11-30 (×4): qty 1

## 2018-11-30 MED ORDER — INSULIN ASPART 100 UNIT/ML ~~LOC~~ SOLN
0.0000 [IU] | Freq: Every day | SUBCUTANEOUS | Status: DC
  Administered 2018-12-01: 23:00:00 2 [IU] via SUBCUTANEOUS
  Filled 2018-11-30: qty 1

## 2018-11-30 MED ORDER — IOPAMIDOL (ISOVUE-370) INJECTION 76%
100.0000 mL | Freq: Once | INTRAVENOUS | Status: AC | PRN
  Administered 2018-11-30: 100 mL via INTRAVENOUS

## 2018-11-30 MED ORDER — GENTAMICIN SULFATE 0.1 % EX CREA
1.0000 "application " | TOPICAL_CREAM | Freq: Every day | CUTANEOUS | Status: DC
  Administered 2018-12-02: 1 via TOPICAL
  Filled 2018-11-30: qty 15

## 2018-11-30 MED ORDER — HEPARIN (PORCINE) 25000 UT/250ML-% IV SOLN
1200.0000 [IU]/h | INTRAVENOUS | Status: DC
  Administered 2018-11-30: 1050 [IU]/h via INTRAVENOUS
  Administered 2018-12-01: 1200 [IU]/h via INTRAVENOUS
  Filled 2018-11-30 (×2): qty 250

## 2018-11-30 MED ORDER — DELFLEX-LC/1.5% DEXTROSE 344 MOSM/L IP SOLN
INTRAPERITONEAL | Status: DC
  Filled 2018-11-30 (×2): qty 3000

## 2018-11-30 NOTE — ED Notes (Signed)
Nurse called lab about pt's lab work and lack of result

## 2018-11-30 NOTE — ED Notes (Signed)
ED TO INPATIENT HANDOFF REPORT  ED Nurse Name and Phone #:  Quillian Quince Wallace Name/Age/Gender Todd Barr 73 y.o. male Room/Bed: ED19A/ED19A  Code Status   Code Status: Not on file  Home/SNF/Other Home Patient oriented to: self, place, time and situation Is this baseline? Yes   Triage Complete: Triage complete  Chief Complaint chest pain;swelling;numbness  Triage Note Pt arrives with complaints of intermittent left sided chest pain/ache that radiates down pt's left leg. Pt reports the pain has been present for a week and states he has also had a "heavines" of pt's left leg for the last week.    Allergies Allergies  Allergen Reactions  . Buchu-Cornsilk-Ch Grass-Hydran     Other reaction(s): Unknown  . Codeine Other (See Comments)    headaches  . Sulfabenzamide Other (See Comments)    High fever  . Thiazide-Type Diuretics Other (See Comments)    Level of Care/Admitting Diagnosis ED Disposition    ED Disposition Condition [facility] [place] [100120]  Level of Care: Telemetry [5]  Covid Evaluation: Asymptomatic Screening Protocol (No Symptoms)  Diagnosis: NSTEMI (non-ST elevated myocardial infarction) [facility]) [id number]  Admitting Physician: Dustin Flock [id number]  Attending Physician: Dustin Flock [phone]  Estimated length of stay: past midnight tomorrow  Certification:: I certify this patient will need inpatient services for at least 2 midnights  PT Class (Do Not Modify): Inpatient [101]  PT Acc Code (Do Not Modify): Private [1]       B Medical/Surgery History Past Medical History:  Diagnosis Date  . CAD (coronary artery disease)   . Diabetes mellitus without complication ([place])   . DM (diabetes mellitus) ([place])   . ESRD (end stage renal disease) ([place])   . Hyperlipemia   . Renal insufficiency    Past Surgical History:  Procedure Laterality Date  . pd cather       A IV  Location/Drains/Wounds Patient Lines/Drains/Airways Status   Active Line/Drains/Airways    Name:   Placement date:   Placement time:   Site:   Days:   Peripheral IV 11/30/18 Right Antecubital   11/30/18    1630    Antecubital   less than 1          Intake/Output Last 24 hours No intake or output data in the 24 hours ending 11/30/18 2058  Labs/Imaging Results for orders placed or performed during the hospital encounter of 11/30/18 (from the past 48 hour(s))  Basic metabolic panel     Status: Abnormal   Collection Time: 11/30/18  2:48 PM  Result Value Ref Range   Sodium 135 135 - 145 mmol/L   Potassium 3.0 (L) 3.5 - 5.1 mmol/L   Chloride 98 98 - 111 mmol/L   CO2 24 22 - 32 mmol/L   Glucose, Bld 169 (H) 70 - 99 mg/dL   BUN 62 (H) 8 - 23 mg/dL   Creatinine, Ser 7.43 (H) 0.61 - 1.24 mg/dL   Calcium 8.1 (L) 8.9 - 10.3 mg/dL   GFR calc non Af Amer 7 (L) >60 mL/min   GFR calc Af Amer 8 (L) >60 mL/min   Anion gap 13 5 - 15    Comment: Performed at [facility], [address] [place], [place] [address]  CBC     Status: Abnormal   Collection Time: 11/30/18  2:48 PM  Result Value Ref Range   WBC 8.3 4.0 - 10.5 K/uL   RBC 2.67 (L)  4.22 - 5.81 MIL/uL   Hemoglobin 8.6 (L) 13.0 - 17.0 g/dL   HCT 24.4 (L) 39.0 - 52.0 %   MCV 91.4 80.0 - 100.0 fL   MCH 32.2 26.0 - 34.0 pg   MCHC 35.2 30.0 - 36.0 g/dL   RDW 13.2 11.5 - 15.5 %   Platelets 136 (L) 150 - 400 K/uL   nRBC 0.0 0.0 - 0.2 %    Comment: Performed at [facility], [place], [place] [address]  Troponin I (High Sensitivity)     Status: Abnormal   Collection Time: 11/30/18  2:48 PM  Result Value Ref Range   Troponin I (High Sensitivity) 21 (H) <18 ng/L    Comment: (NOTE) Elevated high sensitivity troponin I (hsTnI) values and significant  changes across serial measurements may suggest ACS but many other  chronic and acute conditions are known to elevate hsTnI results.  Refer to the "Links"  section for chest pain algorithms and additional  guidance. Performed at [facility], [address] [place], [place] [address]   APTT     Status: Abnormal   Collection Time: 11/30/18  2:48 PM  Result Value Ref Range   aPTT 37 (H) 24 - 36 seconds    Comment:        IF BASELINE aPTT IS ELEVATED, SUGGEST PATIENT RISK ASSESSMENT BE USED TO DETERMINE APPROPRIATE ANTICOAGULANT THERAPY. Performed at [facility], [address] [place], [place] [address]   Protime-INR     Status: None   Collection Time: 11/30/18  2:48 PM  Result Value Ref Range   Prothrombin Time 14.8 11.4 - 15.2 seconds   INR 1.2 0.8 - 1.2    Comment: (NOTE) INR goal varies based on device and disease states. Performed at [facility], [place], [place] [address]   Troponin I (High Sensitivity)     Status: Abnormal   Collection Time: 11/30/18  5:48 PM  Result Value Ref Range   Troponin I (High Sensitivity) 20 (H) <18 ng/L    Comment: (NOTE) Elevated high sensitivity troponin I (hsTnI) values and significant  changes across serial measurements may suggest ACS but many other  chronic and acute conditions are known to elevate hsTnI results.  Refer to the "Links" section for chest pain algorithms and additional  guidance. Performed at [facility], [address] [place], [place] [address]   SARS Coronavirus 2 [facility] order, Performed in [facility] hospital lab) Nasopharyngeal Nasopharyngeal Swab     Status: None   Collection Time: 11/30/18  6:47 PM   Specimen: Nasopharyngeal Swab  Result Value Ref Range   SARS Coronavirus 2 NEGATIVE NEGATIVE    Comment: (NOTE) If result is NEGATIVE SARS-CoV-2 target nucleic acids are NOT DETECTED. The SARS-CoV-2 RNA is generally detectable in upper and lower  respiratory specimens during the acute phase of infection. The lowest  concentration of SARS-CoV-2 viral copies this assay can detect is 250  copies / mL. A  negative result does not preclude SARS-CoV-2 infection  and should not be used as the sole basis for treatment or other  patient management decisions.  A negative result may occur with  improper specimen collection / handling, submission of specimen other  than nasopharyngeal swab, presence of viral mutation(s) within the  areas targeted by this assay, and inadequate number of viral copies  (<250 copies / mL). A negative result must be combined with clinical  observations, patient history, and epidemiological  information. If result is POSITIVE SARS-CoV-2 target nucleic acids are DETECTED. The SARS-CoV-2 RNA is generally detectable in upper and lower  respiratory specimens dur ing the acute phase of infection.  Positive  results are indicative of active infection with SARS-CoV-2.  Clinical  correlation with patient history and other diagnostic information is  necessary to determine patient infection status.  Positive results do  not rule out bacterial infection or co-infection with other viruses. If result is PRESUMPTIVE POSTIVE SARS-CoV-2 nucleic acids MAY BE PRESENT.   A presumptive positive result was obtained on the submitted specimen  and confirmed on repeat testing.  While 2019 novel coronavirus  (SARS-CoV-2) nucleic acids may be present in the submitted sample  additional confirmatory testing may be necessary for epidemiological  and / or clinical management purposes  to differentiate between  SARS-CoV-2 and other Sarbecovirus currently known to infect humans.  If clinically indicated additional testing with an alternate test  methodology [phone]) is advised. The SARS-CoV-2 RNA is generally  detectable in upper and lower respiratory sp ecimens during the acute  phase of infection. The expected result is Negative. Fact Sheet for Patients:  StrictlyIdeas.no Fact Sheet for Healthcare Providers: BankingDealers.co.za This test is not  yet approved or cleared by the [place] FDA and has been authorized for detection and/or diagnosis of SARS-CoV-2 by FDA under an Emergency Use Authorization (EUA).  This EUA will remain in effect (meaning this test can be used) for the duration of the COVID-19 declaration under Section 564(b)(1) of the Act, 21 U.S.C. section 360bbb-3(b)(1), unless the authorization is terminated or revoked sooner. Performed at [facility], [address] [place], [place] [address]   Glucose, capillary     Status: Abnormal   Collection Time: 11/30/18  8:29 PM  Result Value Ref Range   Glucose-Capillary 68 (L) 70 - 99 mg/dL   Dg Chest 2 View  Result Date: 11/30/2018 CLINICAL DATA:  Chest pain EXAM: CHEST - 2 VIEW COMPARISON:  None. FINDINGS: Cardiac shadows within normal limits. The lungs are clear bilaterally. Scattered calcified granulomas are seen. Degenerative changes of the thoracic spine are noted. IMPRESSION: No active cardiopulmonary disease. Electronically Signed   By: Inez Catalina M.D.   On: 11/30/2018 15:30   US Venous Img Lower Unilateral Left  Result Date: 11/30/2018 CLINICAL DATA:  73 year old male with heaviness and swelling of the left leg. EXAM: LEFT LOWER EXTREMITY VENOUS DOPPLER ULTRASOUND TECHNIQUE: Gray-scale sonography with graded compression, as well as color Doppler and duplex ultrasound were performed to evaluate the lower extremity deep venous systems from the level of the common femoral vein and including the common femoral, femoral, profunda femoral, popliteal and calf veins including the posterior tibial, peroneal and gastrocnemius veins when visible. The superficial great saphenous vein was also interrogated. Spectral Doppler was utilized to evaluate flow at rest and with distal augmentation maneuvers in the common femoral, femoral and popliteal veins. COMPARISON:  None. FINDINGS: Contralateral Common Femoral Vein: Respiratory phasicity is normal and symmetric with  the symptomatic side. No evidence of thrombus. Normal compressibility. Common Femoral Vein: No evidence of thrombus. Normal compressibility, respiratory phasicity and response to augmentation. Saphenofemoral Junction: No evidence of thrombus. Normal compressibility and flow on color Doppler imaging. Profunda Femoral Vein: No evidence of thrombus. Normal compressibility and flow on color Doppler imaging. Femoral Vein: No evidence of thrombus. Normal compressibility, respiratory phasicity and response to augmentation. Popliteal Vein: No evidence of thrombus. Normal compressibility, respiratory phasicity and response to augmentation. Calf Veins: No  evidence of thrombus. Normal compressibility and flow on color Doppler imaging. Superficial Great Saphenous Vein: No evidence of thrombus. Normal compressibility. Venous Reflux:  None. Other Findings:  None. IMPRESSION: No evidence of deep venous thrombosis. Electronically Signed   By: Jacqulynn Cadet M.D.   On: 11/30/2018 16:02   Ct Angio Chest/abd/pel For Dissection W And/or Wo Contrast  Result Date: 11/30/2018 CLINICAL DATA:  Aortic disease.  Intermittent left-sided chest pain EXAM: CT ANGIOGRAPHY CHEST, ABDOMEN AND PELVIS TECHNIQUE: Multidetector CT imaging through the chest, abdomen and pelvis was performed using the standard protocol during bolus administration of intravenous contrast. Multiplanar reconstructed images and MIPs were obtained and reviewed to evaluate the vascular anatomy. CONTRAST:  163mL ISOVUE-370 IOPAMIDOL (ISOVUE-370) INJECTION 76% COMPARISON:  None. FINDINGS: CTA CHEST FINDINGS Cardiovascular: Contrast injection is sufficient to demonstrate satisfactory opacification of the pulmonary arteries to the segmental level. There is no pulmonary embolus. The main pulmonary artery is within normal limits for size. There is no CT evidence of acute right heart strain. There is no evidence for a dissection. Aortic calcifications are noted. Coronary artery  calcifications are noted. There is likely coronary artery stent. Heart size is normal. There is no significant pericardial effusion. Mediastinum/Nodes: --No mediastinal or hilar lymphadenopathy. --No axillary lymphadenopathy. --No supraclavicular lymphadenopathy. --Normal thyroid gland. --The esophagus is unremarkable Lungs/Pleura: There is a 4 mm pulmonary nodule in the right lower lobe (axial series 7, image 112). There is an additional 3 mm pulmonary nodule in the right lower lobe (axial series 7, image 126). There is a 4 mm pulmonary nodule in the left lower lobe (axial series 7, image 64). The trachea is grossly unremarkable. There is no pneumothorax. No significant pleural effusion. Musculoskeletal: There is a posterior sebaceous cyst measuring approximately 2.8 cm. There is no displaced fracture. Review of the MIP images confirms the above findings. CTA ABDOMEN AND PELVIS FINDINGS VASCULAR Aorta: Normal caliber aorta without aneurysm, dissection, vasculitis or significant stenosis. Celiac: Patent without evidence of aneurysm, dissection, vasculitis or significant stenosis. SMA: There is some mild to moderate narrowing of the proximal SMA. The distal SMA is grossly unremarkable. Renals: There is mild-to-moderate narrowing of the proximal left renal artery. There is minimal narrowing at the proximal right renal artery. There are 2 right renal arteries. The more inferior renal artery also demonstrates some significant narrowing. IMA: Patent without evidence of aneurysm, dissection, vasculitis or significant stenosis. Inflow: Patent without evidence of aneurysm, dissection, vasculitis or significant stenosis. There is mild narrowing at the origin of the left SFA. Veins: No obvious venous abnormality within the limitations of this arterial phase study. Review of the MIP images confirms the above findings. NON-VASCULAR Hepatobiliary: The liver demonstrates a somewhat nodular contour. There is mild gallbladder wall  thickening which is likely secondary to the presence of free fluid.There is no biliary ductal dilation. Pancreas: Normal contours without ductal dilatation. No peripancreatic fluid collection. Spleen: No splenic laceration or hematoma. Adrenals/Urinary Tract: --Adrenal glands: No adrenal hemorrhage. 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 --Left kidney/ureter: No hydronephrosis or perinephric hematoma. --Urinary bladder: Unremarkable. Stomach/Bowel: --Stomach/Duodenum: No hiatal hernia or other gastric abnormality. Normal duodenal course and caliber. --Small bowel: No dilatation or inflammation. --Colon: No focal abnormality. --Appendix: Normal. Lymphatic: --No retroperitoneal lymphadenopathy. --No mesenteric lymphadenopathy. --No pelvic or inguinal lymphadenopathy. Reproductive: Unremarkable Other: There is a moderate amount of free fluid in the patient's abdomen. There is a catheter coursing through the anterior right abdominal wall and terminating in the left mid  abdomen. This is favored to represent the patient's peritoneal dialysis catheter. There is no free air. There are a few areas of fat stranding in the anterior abdominal wall, left greater than right. This may represent sites of subcutaneous injections. There is no significant abdominal wall hernia. Musculoskeletal. No acute displaced fractures. Review of the MIP images confirms the above findings. IMPRESSION: 1. No acute vascular abnormality detected. Specifically, there is no dissection or pulmonary embolism. 2. Free fluid in the patient's abdomen presumably secondary to the patient's peritoneal dialysis catheter. However, there is a somewhat nodular appearance of the liver which raises suspicion for underlying cirrhosis. 3. Mild wall thickening of the gallbladder which is favored to be secondary to the presence of ascites. If there is clinical concern for acute cholecystitis follow-up with ultrasound is recommended. 4.  Multiple scattered pulmonary nodules measuring up to approximately 4 mm as detailed above. No follow-up needed if patient is low-risk (and has no known or suspected primary neoplasm). Non-contrast chest CT can be considered in 12 months if patient is high-risk. This recommendation follows the consensus statement: Guidelines for Management of Incidental Pulmonary Nodules Detected on CT Images: From the Fleischner Society 2017; Radiology 2017; 284:228-243. 5. Atrophic right kidney.  No hydronephrosis. Electronically Signed   By: Constance Holster M.D.   On: 11/30/2018 17:33    Pending Labs Unresulted Labs (From admission, onward)    Start     Ordered   12/01/18 0300  Heparin level (unfractionated)  Once-Timed,   STAT     11/30/18 1857   12/01/18 0300  CBC  Once-Timed,   STAT     11/30/18 1857   11/30/18 1916  Hemoglobin A1c  Once,   STAT    Comments: To assess prior glycemic control    11/30/18 1916   Signed and Held  TSH  Once,   R     Signed and Held   Signed and Held  Hemoglobin A1c  Once,   R     Signed and Held   Signed and Held  Lipid panel  Tomorrow morning,   R     Signed and Held   Signed and Held  Basic metabolic panel  Tomorrow morning,   R     Signed and Held   Signed and Held  CBC  Tomorrow morning,   R     Signed and Held          Vitals/Pain Today's Vitals   11/30/18 1900 11/30/18 1930 11/30/18 2000 11/30/18 2030  BP: (!) 166/70 (!) 153/68 (!) 159/77 (!) 163/79  Pulse: (!) 102 97 100 (!) 105  Resp: 18 20 (!) 21 (!) 24  Temp:      TempSrc:      SpO2: 93% 92% 94% 93%  Weight:      Height:      PainSc:        Isolation Precautions No active isolations  Medications Medications  heparin ADULT infusion 100 units/mL (25000 units/232mL sodium chloride 0.45%) (1,050 Units/hr Intravenous New Bag/Given 11/30/18 1843)  insulin aspart (novoLOG) injection 0-9 Units (has no administration in time range)  insulin aspart (novoLOG) injection 0-5 Units (has no  administration in time range)  gentamicin cream (GARAMYCIN) 0.1 % 1 application (has no administration in time range)  dialysis solution 2.5% low-MG/low-CA dianeal solution (has no administration in time range)  dialysis solution 1.5% low-MG/low-CA dianeal solution (has no administration in time range)  iopamidol (ISOVUE-370) 76 % injection 100 mL (100 mLs  Intravenous Contrast Given 11/30/18 1702)  heparin bolus via infusion 4,000 Units (4,000 Units Intravenous Bolus from Bag 11/30/18 1841)    Mobility walks Low fall risk   Focused Assessments Cardiac Assessment Handoff:  Cardiac Rhythm: Normal sinus rhythm No results found for: CKTOTAL, CKMB, CKMBINDEX, TROPONINI No results found for: DDIMER Does the Patient currently have chest pain? No      R Recommendations: See Admitting Provider Note  Report given to:   Additional Notes:

## 2018-11-30 NOTE — Consult Note (Signed)
[place] for Heparin Indication: ACS / STEMI/UA  Allergies  Allergen Reactions  . Buchu-Cornsilk-Ch Grass-Hydran     Other reaction(s): Unknown  . Codeine Other (See Comments)    headaches  . Sulfabenzamide Other (See Comments)    High fever    Patient Measurements: Height: 5\' 10"  (177.8 cm) Weight: 201 lb (91.2 kg) IBW/kg (Calculated) : 73 Heparin Dosing Weight: 91.2 kg   Vital Signs: Temp: 98.2 F (36.8 C) (08/18 1511) Temp Source: Oral (08/18 1511) BP: 161/74 (08/18 1830) Pulse Rate: 96 (08/18 1830)  Labs: Recent Labs    11/30/18 1448 11/30/18 1748  HGB 8.6*  --   HCT 24.4*  --   PLT 136*  --   APTT 37*  --   LABPROT 14.8  --   INR 1.2  --   CREATININE 7.43*  --   TROPONINIHS 21* 20*    Estimated Creatinine Clearance: 10.4 mL/min (A) (by C-G formula based on SCr of 7.43 mg/dL (H)).   Medications:  No anticoagulants PTA- confirmed with patient.  Plavix and aspirin  Assessment: Todd Barr is a 73 y.o. male with a history of hypertension, end-stage renal disease on peritoneal dialysis, diabetes, GERD, CAD status post MI and 3 stents who reports intermittent left-sided chest pain that radiates through his body into his left leg.   Troponin 21 >> 20 Hgb 8.6 & Plt 136   Goal of Therapy:  Heparin level 0.3-0.7 units/ml Monitor platelets by anticoagulation protocol: Yes   Plan:  Baseline labs have been ordered  Heparin DW: 91.2 kg  Give 4000 units bolus x 1 - confirmed with Dr. Joni Fears about whether to administer bolus and it was okay to give bolus dose. Start heparin infusion at 1050 units/hr Check anti-Xa level in 8 hours and daily while on heparin, per protocol Continue to monitor H&H and platelets   Todd Barr 11/30/2018,6:50 PM

## 2018-11-30 NOTE — ED Notes (Addendum)
PD nurse adjusted cycler machine. Patient states that his breathing is much better.

## 2018-11-30 NOTE — ED Triage Notes (Signed)
Pt arrives with complaints of intermittent left sided chest pain/ache that radiates down pt's left leg. Pt reports the pain has been present for a week and states he has also had a "heavines" of pt's left leg for the last week.

## 2018-11-30 NOTE — ED Notes (Signed)
Patient states that he is having difficulty breathing. He feels that his belly is fuller than he normally gets with his home machine. PD nurse notified. PD nurse en route to room.

## 2018-11-30 NOTE — ED Notes (Signed)
Patient transported to CT 

## 2018-11-30 NOTE — H&P (Signed)
[facility] NAME: Jhonathan Hoxit    MR#:  [id number]  DATE OF BIRTH:  07-Aug-1945  DATE OF ADMISSION:  11/30/2018  PRIMARY CARE PHYSICIAN: System, Pcp Not In   REQUESTING/REFERRING PHYSICIAN: Carrie Mew, MD  CHIEF COMPLAINT:   Chief Complaint  Patient presents with  . Chest Pain    HISTORY OF PRESENT ILLNESS: Ireoluwa Soisson  is a 73 y.o. male with a known history of coronary artery disease, diabetes type 2, end-stage renal disease, hyperlipidemia who is presenting to the hospital with chest pain. Patient states that his symptoms started for 1 week and has progressively gotten worse.  He also states the pain goes down to his leg.  There is no nausea or vomiting.  No radiation of the pain.  He states that is similar to previously when he had required his stents.    PAST MEDICAL HISTORY:   Past Medical History:  Diagnosis Date  . CAD (coronary artery disease)   . Diabetes mellitus without complication ([place])   . DM (diabetes mellitus) ([place])   . ESRD (end stage renal disease) ([place])   . Hyperlipemia   . Renal insufficiency     PAST SURGICAL HISTORY:  Past Surgical History:  Procedure Laterality Date  . pd cather      SOCIAL HISTORY:  Social History   Tobacco Use  . Smoking status: Never Smoker  . Smokeless tobacco: Never Used  Substance Use Topics  . Alcohol use: Not on file    FAMILY HISTORY:  Family History  Problem Relation Age of Onset  . Hypertension Mother     DRUG ALLERGIES:  Allergies  Allergen Reactions  . Buchu-Cornsilk-Ch Grass-Hydran     Other reaction(s): Unknown  . Codeine Other (See Comments)    headaches  . Sulfabenzamide Other (See Comments)    High fever    REVIEW OF SYSTEMS:   CONSTITUTIONAL: No fever, fatigue or weakness.  EYES: No blurred or double vision.  EARS, NOSE, AND THROAT: No tinnitus or ear pain.  RESPIRATORY: No cough, shortness of breath, wheezing or hemoptysis.   CARDIOVASCULAR: Positive chest pain, orthopnea, edema.  GASTROINTESTINAL: No nausea, vomiting, diarrhea or abdominal pain.  GENITOURINARY: No dysuria, hematuria.  ENDOCRINE: No polyuria, nocturia,  HEMATOLOGY: No anemia, easy bruising or bleeding SKIN: No rash or lesion. MUSCULOSKELETAL: No joint pain or arthritis.   NEUROLOGIC: No tingling, numbness, weakness.  PSYCHIATRY: No anxiety or depression.   MEDICATIONS AT HOME:  Prior to Admission medications   Medication Sig Start Date End Date Taking? Authorizing Provider  amLODipine (NORVASC) 10 MG tablet Take 10 mg by mouth daily.    [provider]  aspirin 81 MG chewable tablet Chew by mouth.    [provider]  atorvastatin (LIPITOR) 80 MG tablet Take 80 mg by mouth at bedtime.    [provider]  calcitRIOL (ROCALTROL) 0.25 MCG capsule Take 0.25 mcg by mouth daily.    [provider]  calcium acetate (PHOSLO) 667 MG capsule Take 1,334 mg by mouth 2 (two) times daily with a meal.    [provider]  Cholecalciferol 1000 units tablet Take 1,000 Units by mouth daily.    [provider]  clopidogrel (PLAVIX) 75 MG tablet Take 75 mg by mouth daily.    [provider]  ferrous sulfate 325 (65 FE) MG EC tablet Take 325 mg by mouth 3 (three) times daily with meals.    [provider]  glucose blood test strip 1 each by Other route as needed for other. Use as instructed    [provider]  hydrALAZINE (APRESOLINE) 50 MG tablet Take 50 mg by mouth 3 (three) times daily.    [provider]  insulin glargine (LANTUS) 100 UNIT/ML injection Inject 25 Units into the skin 2 (two) times daily.    [provider]  insulin regular (NOVOLIN R,HUMULIN R) 100 units/mL injection Inject 15 Units into the skin 2 (two) times daily before a meal.    [provider]  isosorbide mononitrate (IMDUR) 60 MG 24 hr tablet Take 60 mg by mouth every morning.     [provider]  magnesium oxide (MAG-OX) 400 MG tablet Take 400 mg by mouth daily.    [provider]  metoprolol (TOPROL-XL) 200 MG 24 hr tablet Take 100 mg by mouth daily.    [provider]  nitroGLYCERIN (NITROSTAT) 0.4 MG SL tablet Place 0.4 mg under the tongue every 5 (five) minutes as needed for chest pain.    [provider]  omeprazole (PRILOSEC) 20 MG capsule Take 20 mg by mouth every morning.    [provider]  ranitidine (ZANTAC) 150 MG tablet Take 150 mg by mouth 2 (two) times daily as needed for heartburn.    [provider]  simethicone (MYLICON) 80 MG chewable tablet Chew 80 mg by mouth 3 (three) times daily as needed for flatulence.    [provider]  sodium bicarbonate 650 MG tablet Take 650 mg by mouth 2 (two) times daily.    [provider]      PHYSICAL EXAMINATION:   VITAL SIGNS: Blood pressure (!) 161/74, pulse 96, temperature 98.2 F (36.8 C), temperature source Oral, resp. rate 16, height 5\' 10"  (1.778 m), weight 91.2 kg, SpO2 95 %.  GENERAL:  74 y.o.-year-old patient lying in the bed with no acute distress.  EYES: Pupils equal, round, reactive to light and accommodation. No scleral icterus. Extraocular muscles intact.  HEENT: Head atraumatic, normocephalic. Oropharynx and nasopharynx clear.  NECK:  Supple, no jugular venous distention. No thyroid enlargement, no tenderness.  LUNGS: Normal breath sounds bilaterally, no wheezing, rales,rhonchi or crepitation. No use of accessory muscles of respiration.  CARDIOVASCULAR: S1, S2 normal. No murmurs, rubs, or gallops.  ABDOMEN: Soft, nontender, nondistended. Bowel sounds present. No organomegaly or mass.  EXTREMITIES: No pedal edema, cyanosis, or clubbing.  NEUROLOGIC: Cranial nerves II through XII are intact. Muscle strength 5/5 in all extremities. Sensation intact. Gait not checked.  PSYCHIATRIC: The patient is alert and oriented x 3.  SKIN: No  obvious rash, lesion, or ulcer.   LABORATORY PANEL:   CBC Recent Labs  Lab 11/30/18 1448  WBC 8.3  HGB 8.6*  HCT 24.4*  PLT 136*  MCV 91.4  MCH 32.2  MCHC 35.2  RDW 13.2   ------------------------------------------------------------------------------------------------------------------  Chemistries  Recent Labs  Lab 11/30/18 1448  NA 135  K 3.0*  CL 98  CO2 24  GLUCOSE 169*  BUN 62*  CREATININE 7.43*  CALCIUM 8.1*   ------------------------------------------------------------------------------------------------------------------ estimated creatinine clearance is 10.4 mL/min (A) (by C-G formula based on SCr of 7.43 mg/dL (H)). ------------------------------------------------------------------------------------------------------------------ No results for input(s): TSH, T4TOTAL, T3FREE, THYROIDAB in the last 72 hours.  Invalid input(s): FREET3   Coagulation profile Recent Labs  Lab 11/30/18 1448  INR 1.2   ------------------------------------------------------------------------------------------------------------------- No results for input(s): DDIMER in the last 72 hours. -------------------------------------------------------------------------------------------------------------------  Cardiac Enzymes No results for input(s): CKMB, TROPONINI,  MYOGLOBIN in the last 168 hours.  Invalid input(s): CK ------------------------------------------------------------------------------------------------------------------ Invalid input(s): POCBNP  ---------------------------------------------------------------------------------------------------------------  Urinalysis No results found for: COLORURINE, APPEARANCEUR, LABSPEC, PHURINE, GLUCOSEU, HGBUR, BILIRUBINUR, KETONESUR, PROTEINUR, UROBILINOGEN, NITRITE, LEUKOCYTESUR   RADIOLOGY: Dg Chest 2 View  Result Date: 11/30/2018 CLINICAL DATA:  Chest pain EXAM: CHEST - 2 VIEW COMPARISON:  None. FINDINGS: Cardiac shadows  within normal limits. The lungs are clear bilaterally. Scattered calcified granulomas are seen. Degenerative changes of the thoracic spine are noted. IMPRESSION: No active cardiopulmonary disease. Electronically Signed   By: Inez Catalina M.D.   On: 11/30/2018 15:30   US Venous Img Lower Unilateral Left  Result Date: 11/30/2018 CLINICAL DATA:  73 year old male with heaviness and swelling of the left leg. EXAM: LEFT LOWER EXTREMITY VENOUS DOPPLER ULTRASOUND TECHNIQUE: Gray-scale sonography with graded compression, as well as color Doppler and duplex ultrasound were performed to evaluate the lower extremity deep venous systems from the level of the common femoral vein and including the common femoral, femoral, profunda femoral, popliteal and calf veins including the posterior tibial, peroneal and gastrocnemius veins when visible. The superficial great saphenous vein was also interrogated. Spectral Doppler was utilized to evaluate flow at rest and with distal augmentation maneuvers in the common femoral, femoral and popliteal veins. COMPARISON:  None. FINDINGS: Contralateral Common Femoral Vein: Respiratory phasicity is normal and symmetric with the symptomatic side. No evidence of thrombus. Normal compressibility. Common Femoral Vein: No evidence of thrombus. Normal compressibility, respiratory phasicity and response to augmentation. Saphenofemoral Junction: No evidence of thrombus. Normal compressibility and flow on color Doppler imaging. Profunda Femoral Vein: No evidence of thrombus. Normal compressibility and flow on color Doppler imaging. Femoral Vein: No evidence of thrombus. Normal compressibility, respiratory phasicity and response to augmentation. Popliteal Vein: No evidence of thrombus. Normal compressibility, respiratory phasicity and response to augmentation. Calf Veins: No evidence of thrombus. Normal compressibility and flow on color Doppler imaging. Superficial Great Saphenous Vein: No evidence of  thrombus. Normal compressibility. Venous Reflux:  None. Other Findings:  None. IMPRESSION: No evidence of deep venous thrombosis. Electronically Signed   By: Jacqulynn Cadet M.D.   On: 11/30/2018 16:02   Ct Angio Chest/abd/pel For Dissection W And/or Wo Contrast  Result Date: 11/30/2018 CLINICAL DATA:  Aortic disease.  Intermittent left-sided chest pain EXAM: CT ANGIOGRAPHY CHEST, ABDOMEN AND PELVIS TECHNIQUE: Multidetector CT imaging through the chest, abdomen and pelvis was performed using the standard protocol during bolus administration of intravenous contrast. Multiplanar reconstructed images and MIPs were obtained and reviewed to evaluate the vascular anatomy. CONTRAST:  111mL ISOVUE-370 IOPAMIDOL (ISOVUE-370) INJECTION 76% COMPARISON:  None. FINDINGS: CTA CHEST FINDINGS Cardiovascular: Contrast injection is sufficient to demonstrate satisfactory opacification of the pulmonary arteries to the segmental level. There is no pulmonary embolus. The main pulmonary artery is within normal limits for size. There is no CT evidence of acute right heart strain. There is no evidence for a dissection. Aortic calcifications are noted. Coronary artery calcifications are noted. There is likely coronary artery stent. Heart size is normal. There is no significant pericardial effusion. Mediastinum/Nodes: --No mediastinal or hilar lymphadenopathy. --No axillary lymphadenopathy. --No supraclavicular lymphadenopathy. --Normal thyroid gland. --The esophagus is unremarkable Lungs/Pleura: There is a 4 mm pulmonary nodule in the right lower lobe (axial series 7, image 112). There is an additional 3 mm pulmonary nodule in the right lower lobe (axial series 7, image 126). There is a 4 mm pulmonary nodule in the left lower lobe (axial series 7, image 64). The trachea is  grossly unremarkable. There is no pneumothorax. No significant pleural effusion. Musculoskeletal: There is a posterior sebaceous cyst measuring approximately 2.8 cm.  There is no displaced fracture. Review of the MIP images confirms the above findings. CTA ABDOMEN AND PELVIS FINDINGS VASCULAR Aorta: Normal caliber aorta without aneurysm, dissection, vasculitis or significant stenosis. Celiac: Patent without evidence of aneurysm, dissection, vasculitis or significant stenosis. SMA: There is some mild to moderate narrowing of the proximal SMA. The distal SMA is grossly unremarkable. Renals: There is mild-to-moderate narrowing of the proximal left renal artery. There is minimal narrowing at the proximal right renal artery. There are 2 right renal arteries. The more inferior renal artery also demonstrates some significant narrowing. IMA: Patent without evidence of aneurysm, dissection, vasculitis or significant stenosis. Inflow: Patent without evidence of aneurysm, dissection, vasculitis or significant stenosis. There is mild narrowing at the origin of the left SFA. Veins: No obvious venous abnormality within the limitations of this arterial phase study. Review of the MIP images confirms the above findings. NON-VASCULAR Hepatobiliary: The liver demonstrates a somewhat nodular contour. There is mild gallbladder wall thickening which is likely secondary to the presence of free fluid.There is no biliary ductal dilation. Pancreas: Normal contours without ductal dilatation. No peripancreatic fluid collection. Spleen: No splenic laceration or hematoma. Adrenals/Urinary Tract: --Adrenal glands: No adrenal hemorrhage. 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 --Left kidney/ureter: No hydronephrosis or perinephric hematoma. --Urinary bladder: Unremarkable. Stomach/Bowel: --Stomach/Duodenum: No hiatal hernia or other gastric abnormality. Normal duodenal course and caliber. --Small bowel: No dilatation or inflammation. --Colon: No focal abnormality. --Appendix: Normal. Lymphatic: --No retroperitoneal lymphadenopathy. --No mesenteric lymphadenopathy. --No pelvic or  inguinal lymphadenopathy. Reproductive: Unremarkable Other: There is a moderate amount of free fluid in the patient's abdomen. There is a catheter coursing through the anterior right abdominal wall and terminating in the left mid abdomen. This is favored to represent the patient's peritoneal dialysis catheter. There is no free air. There are a few areas of fat stranding in the anterior abdominal wall, left greater than right. This may represent sites of subcutaneous injections. There is no significant abdominal wall hernia. Musculoskeletal. No acute displaced fractures. Review of the MIP images confirms the above findings. IMPRESSION: 1. No acute vascular abnormality detected. Specifically, there is no dissection or pulmonary embolism. 2. Free fluid in the patient's abdomen presumably secondary to the patient's peritoneal dialysis catheter. However, there is a somewhat nodular appearance of the liver which raises suspicion for underlying cirrhosis. 3. Mild wall thickening of the gallbladder which is favored to be secondary to the presence of ascites. If there is clinical concern for acute cholecystitis follow-up with ultrasound is recommended. 4. Multiple scattered pulmonary nodules measuring up to approximately 4 mm as detailed above. No follow-up needed if patient is low-risk (and has no known or suspected primary neoplasm). Non-contrast chest CT can be considered in 12 months if patient is high-risk. This recommendation follows the consensus statement: Guidelines for Management of Incidental Pulmonary Nodules Detected on CT Images: From the Fleischner Society 2017; Radiology 2017; 284:228-243. 5. Atrophic right kidney.  No hydronephrosis. Electronically Signed   By: Constance Holster M.D.   On: 11/30/2018 17:33    EKG: Orders placed or performed during the hospital encounter of 11/30/18  . EKG 12-Lead  . EKG 12-Lead  . ED EKG  . ED EKG    IMPRESSION AND PLAN: Patient is a 74 year old white male with  history of coronary artery disease with previous stents placed who is presenting  with chest pain  1.  Chest pain due to unstable angina patient will be on heparin drip Continue aspirin and Plavix Continue metoprolol Cardiology has been consult I will keep him n.p.o. after midnight for possible cath  2.  Essential hypertension continue Norvasc hydralazine isosorbide mononitrate and metoprolol  3. Diabetes type 2 we will continue his insulin Place him on sliding scale insulin  4.  Hyperlipidemia continue Lipitor  5.  GERD continue PPI  6.  Miscellaneous heparin for DVT prophylaxis    All the records are reviewed and case discussed with ED provider. Management plans discussed with the patient, family and they are in agreement.  CODE STATUS: Advance Directive Documentation     Most Recent Value  Type of Advance Directive  Living will  Pre-existing out of facility DNR order (yellow form or pink MOST form)  -  "MOST" Form in Place?  -       TOTAL TIME TAKING CARE OF THIS PATIENT:55 minutes.    Dustin Flock M.D on 11/30/2018 at 7:11 PM  Between 7am to 6pm - Pager - [phone]  After 6pm go to [email or web address] - password [email or web address] [facility]  [employer] [facility]  [phone]  CC: Primary care physician; System, Pcp Not In

## 2018-11-30 NOTE — ED Provider Notes (Signed)
[facility] Emergency Department Provider Note  ____________________________________________  Time seen: Approximately 4:20 PM  I have reviewed the triage vital signs and the nursing notes.   HISTORY  Chief Complaint Chest Pain    HPI Todd Barr is a 73 y.o. male with a history of hypertension, end-stage renal disease on peritoneal dialysis, diabetes, GERD, CAD status post MI and 3 stents who reports intermittent left-sided chest pain that radiates through his body into his left leg.  Worse with standing and walking, better with rest.  Currently pain-free at rest.  Is been getting worse over the past week.  The leg also feels heavy and weak.  Denies any falls or injuries.  Associated with shortness of breath.  No vomiting or diaphoresis.  No dizziness vision changes or syncope.  Pain is moderate intensity when present.  Feels like aching.      Past Medical History:  Diagnosis Date  . Diabetes mellitus without complication ([place])   . Renal insufficiency      Patient Active Problem List   Diagnosis Date Noted  . Hypertension 11/23/2017  . Chronic kidney disease, stage 4 (severe) ([place]) 11/23/2017  . Diabetes mellitus ([place]) 11/23/2017  . GERD (gastroesophageal reflux disease) 11/23/2017  . Hypertriglyceridemia 11/23/2017  . Coronary atherosclerosis 11/23/2017  . Sleep apnea 11/23/2017  . NSTEMI (non-ST elevated myocardial infarction) ([place]) 11/23/2017  . Personal history of other malignant neoplasm of skin 09/23/2013     History reviewed. No pertinent surgical history.   Prior to Admission medications   Medication Sig Start Date End Date Taking? Authorizing Provider  amLODipine (NORVASC) 10 MG tablet Take 10 mg by mouth daily.    [provider]  aspirin 81 MG chewable tablet Chew by mouth.    [provider]  atorvastatin (LIPITOR) 80 MG tablet Take 80 mg by mouth at bedtime.    [provider]  calcitRIOL (ROCALTROL)  0.25 MCG capsule Take 0.25 mcg by mouth daily.    [provider]  calcium acetate (PHOSLO) 667 MG capsule Take 1,334 mg by mouth 2 (two) times daily with a meal.    [provider]  Cholecalciferol 1000 units tablet Take 1,000 Units by mouth daily.    [provider]  clopidogrel (PLAVIX) 75 MG tablet Take 75 mg by mouth daily.    [provider]  ferrous sulfate 325 (65 FE) MG EC tablet Take 325 mg by mouth 3 (three) times daily with meals.    [provider]  glucose blood test strip 1 each by Other route as needed for other. Use as instructed    [provider]  hydrALAZINE (APRESOLINE) 50 MG tablet Take 50 mg by mouth 3 (three) times daily.    [provider]  insulin glargine (LANTUS) 100 UNIT/ML injection Inject 25 Units into the skin 2 (two) times daily.    [provider]  insulin regular (NOVOLIN R,HUMULIN R) 100 units/mL injection Inject 15 Units into the skin 2 (two) times daily before a meal.    [provider]  isosorbide mononitrate (IMDUR) 60 MG 24 hr tablet Take 60 mg by mouth every morning.    [provider]  magnesium oxide (MAG-OX) 400 MG tablet Take 400 mg by mouth daily.    [provider]  metoprolol (TOPROL-XL) 200 MG 24 hr tablet Take 100 mg by mouth daily.    [provider]  nitroGLYCERIN (NITROSTAT) 0.4 MG SL tablet Place 0.4 mg under the tongue every  5 (five) minutes as needed for chest pain.    [provider]  omeprazole (PRILOSEC) 20 MG capsule Take 20 mg by mouth every morning.    [provider]  ranitidine (ZANTAC) 150 MG tablet Take 150 mg by mouth 2 (two) times daily as needed for heartburn.    [provider]  simethicone (MYLICON) 80 MG chewable tablet Chew 80 mg by mouth 3 (three) times daily as needed for flatulence.    [provider]  sodium bicarbonate 650 MG tablet Take 650 mg by mouth 2 (two) times daily.     [provider]     Allergies Buchu-cornsilk-ch grass-hydran, Codeine, and Sulfabenzamide   No family history on file.  Social History Social History   Tobacco Use  . Smoking status: Never Smoker  . Smokeless tobacco: Never Used  Substance Use Topics  . Alcohol use: Not on file  . Drug use: Not on file    Review of Systems  Constitutional:   No fever or chills.  ENT:   No sore throat. No rhinorrhea. Cardiovascular:   Positive as above chest pain without syncope. Respiratory:   No dyspnea or cough. Gastrointestinal:   Negative for abdominal pain, vomiting and diarrhea.  Musculoskeletal:   Positive left leg pain and heaviness.  Positive chronic swelling of the left leg which is unchanged All other systems reviewed and are negative except as documented above in ROS and HPI.  ____________________________________________   PHYSICAL EXAM:  VITAL SIGNS: ED Triage Vitals [11/30/18 1511]  Enc Vitals Group     BP (!) 178/67     Pulse Rate 96     Resp 17     Temp 98.2 F (36.8 C)     Temp Source Oral     SpO2 96 %     Weight 201 lb (91.2 kg)     Height 5\' 10"  (1.778 m)     Head Circumference      Peak Flow      Pain Score 7     Pain Loc      Pain Edu?      Excl. in [place]?     Vital signs reviewed, nursing assessments reviewed.   Constitutional:   Alert and oriented. Non-toxic appearance. Eyes:   Conjunctivae are normal. EOMI. PERRL. ENT      Head:   Normocephalic and atraumatic.      Nose:   No congestion/rhinnorhea.       Mouth/Throat:   MMM, no pharyngeal erythema. No peritonsillar mass.       Neck:   No meningismus. Full ROM. Hematological/Lymphatic/Immunilogical:   No cervical lymphadenopathy. Cardiovascular:   RRR. Symmetric bilateral radial and DP pulses.  Strong thrill from AV fistula in the left forearm.  No murmurs. Cap refill less than 2 seconds. Respiratory:   Normal respiratory effort without tachypnea/retractions. Breath sounds are clear and  equal bilaterally. No wheezes/rales/rhonchi. Gastrointestinal:   Soft and nontender. Non distended. There is no CVA tenderness.  No rebound, rigidity, or guarding.  Musculoskeletal:   Pain limited range of motion in the left leg, otherwise normal range of motion in all extremities.  No pain with passive range of motion of the left leg.  No joint effusions.  No lower extremity tenderness.  1+ pitting edema left lower extremity with increased calf circumference compared to right lower extremity.  Patient reports this is chronic and unchanged.  No calf tenderness.  Negative Homans sign. Neurologic:   Normal speech and  language.  Motor grossly intact. No acute focal neurologic deficits are appreciated.  Skin:    Skin is warm, dry and intact. No rash noted.  No petechiae, purpura, or bullae.  ____________________________________________    LABS (pertinent positives/negatives) (all labs ordered are listed, but only abnormal results are displayed) Labs Reviewed  BASIC METABOLIC PANEL - Abnormal; Notable for the following components:      Result Value   Potassium 3.0 (*)    Glucose, Bld 169 (*)    BUN 62 (*)    Creatinine, Ser 7.43 (*)    Calcium 8.1 (*)    GFR calc non Af Amer 7 (*)    GFR calc Af Amer 8 (*)    All other components within normal limits  CBC - Abnormal; Notable for the following components:   RBC 2.67 (*)    Hemoglobin 8.6 (*)    HCT 24.4 (*)    Platelets 136 (*)    All other components within normal limits  TROPONIN I (HIGH SENSITIVITY) - Abnormal; Notable for the following components:   Troponin I (High Sensitivity) 21 (*)    All other components within normal limits  TROPONIN I (HIGH SENSITIVITY) - Abnormal; Notable for the following components:   Troponin I (High Sensitivity) 20 (*)    All other components within normal limits  APTT  PROTIME-INR   ____________________________________________   EKG  Interpreted by me Normal sinus rhythm rate of 97, normal  axis.  Right bundle branch block.  No acute ischemic changes.  ____________________________________________    RADIOLOGY  Dg Chest 2 View  Result Date: 11/30/2018 CLINICAL DATA:  Chest pain EXAM: CHEST - 2 VIEW COMPARISON:  None. FINDINGS: Cardiac shadows within normal limits. The lungs are clear bilaterally. Scattered calcified granulomas are seen. Degenerative changes of the thoracic spine are noted. IMPRESSION: No active cardiopulmonary disease. Electronically Signed   By: Inez Catalina M.D.   On: 11/30/2018 15:30   US Venous Img Lower Unilateral Left  Result Date: 11/30/2018 CLINICAL DATA:  73 year old male with heaviness and swelling of the left leg. EXAM: LEFT LOWER EXTREMITY VENOUS DOPPLER ULTRASOUND TECHNIQUE: Gray-scale sonography with graded compression, as well as color Doppler and duplex ultrasound were performed to evaluate the lower extremity deep venous systems from the level of the common femoral vein and including the common femoral, femoral, profunda femoral, popliteal and calf veins including the posterior tibial, peroneal and gastrocnemius veins when visible. The superficial great saphenous vein was also interrogated. Spectral Doppler was utilized to evaluate flow at rest and with distal augmentation maneuvers in the common femoral, femoral and popliteal veins. COMPARISON:  None. FINDINGS: Contralateral Common Femoral Vein: Respiratory phasicity is normal and symmetric with the symptomatic side. No evidence of thrombus. Normal compressibility. Common Femoral Vein: No evidence of thrombus. Normal compressibility, respiratory phasicity and response to augmentation. Saphenofemoral Junction: No evidence of thrombus. Normal compressibility and flow on color Doppler imaging. Profunda Femoral Vein: No evidence of thrombus. Normal compressibility and flow on color Doppler imaging. Femoral Vein: No evidence of thrombus. Normal compressibility, respiratory phasicity and response to  augmentation. Popliteal Vein: No evidence of thrombus. Normal compressibility, respiratory phasicity and response to augmentation. Calf Veins: No evidence of thrombus. Normal compressibility and flow on color Doppler imaging. Superficial Great Saphenous Vein: No evidence of thrombus. Normal compressibility. Venous Reflux:  None. Other Findings:  None. IMPRESSION: No evidence of deep venous thrombosis. Electronically Signed   By: Jacqulynn Cadet M.D.   On: 11/30/2018 16:02  Ct Angio Chest/abd/pel For Dissection W And/or Wo Contrast  Result Date: 11/30/2018 CLINICAL DATA:  Aortic disease.  Intermittent left-sided chest pain EXAM: CT ANGIOGRAPHY CHEST, ABDOMEN AND PELVIS TECHNIQUE: Multidetector CT imaging through the chest, abdomen and pelvis was performed using the standard protocol during bolus administration of intravenous contrast. Multiplanar reconstructed images and MIPs were obtained and reviewed to evaluate the vascular anatomy. CONTRAST:  118mL ISOVUE-370 IOPAMIDOL (ISOVUE-370) INJECTION 76% COMPARISON:  None. FINDINGS: CTA CHEST FINDINGS Cardiovascular: Contrast injection is sufficient to demonstrate satisfactory opacification of the pulmonary arteries to the segmental level. There is no pulmonary embolus. The main pulmonary artery is within normal limits for size. There is no CT evidence of acute right heart strain. There is no evidence for a dissection. Aortic calcifications are noted. Coronary artery calcifications are noted. There is likely coronary artery stent. Heart size is normal. There is no significant pericardial effusion. Mediastinum/Nodes: --No mediastinal or hilar lymphadenopathy. --No axillary lymphadenopathy. --No supraclavicular lymphadenopathy. --Normal thyroid gland. --The esophagus is unremarkable Lungs/Pleura: There is a 4 mm pulmonary nodule in the right lower lobe (axial series 7, image 112). There is an additional 3 mm pulmonary nodule in the right lower lobe (axial series 7,  image 126). There is a 4 mm pulmonary nodule in the left lower lobe (axial series 7, image 64). The trachea is grossly unremarkable. There is no pneumothorax. No significant pleural effusion. Musculoskeletal: There is a posterior sebaceous cyst measuring approximately 2.8 cm. There is no displaced fracture. Review of the MIP images confirms the above findings. CTA ABDOMEN AND PELVIS FINDINGS VASCULAR Aorta: Normal caliber aorta without aneurysm, dissection, vasculitis or significant stenosis. Celiac: Patent without evidence of aneurysm, dissection, vasculitis or significant stenosis. SMA: There is some mild to moderate narrowing of the proximal SMA. The distal SMA is grossly unremarkable. Renals: There is mild-to-moderate narrowing of the proximal left renal artery. There is minimal narrowing at the proximal right renal artery. There are 2 right renal arteries. The more inferior renal artery also demonstrates some significant narrowing. IMA: Patent without evidence of aneurysm, dissection, vasculitis or significant stenosis. Inflow: Patent without evidence of aneurysm, dissection, vasculitis or significant stenosis. There is mild narrowing at the origin of the left SFA. Veins: No obvious venous abnormality within the limitations of this arterial phase study. Review of the MIP images confirms the above findings. NON-VASCULAR Hepatobiliary: The liver demonstrates a somewhat nodular contour. There is mild gallbladder wall thickening which is likely secondary to the presence of free fluid.There is no biliary ductal dilation. Pancreas: Normal contours without ductal dilatation. No peripancreatic fluid collection. Spleen: No splenic laceration or hematoma. Adrenals/Urinary Tract: --Adrenal glands: No adrenal hemorrhage. 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 --Left kidney/ureter: No hydronephrosis or perinephric hematoma. --Urinary bladder: Unremarkable. Stomach/Bowel: --Stomach/Duodenum:  No hiatal hernia or other gastric abnormality. Normal duodenal course and caliber. --Small bowel: No dilatation or inflammation. --Colon: No focal abnormality. --Appendix: Normal. Lymphatic: --No retroperitoneal lymphadenopathy. --No mesenteric lymphadenopathy. --No pelvic or inguinal lymphadenopathy. Reproductive: Unremarkable Other: There is a moderate amount of free fluid in the patient's abdomen. There is a catheter coursing through the anterior right abdominal wall and terminating in the left mid abdomen. This is favored to represent the patient's peritoneal dialysis catheter. There is no free air. There are a few areas of fat stranding in the anterior abdominal wall, left greater than right. This may represent sites of subcutaneous injections. There is no significant abdominal wall hernia. Musculoskeletal. No acute displaced fractures.  Review of the MIP images confirms the above findings. IMPRESSION: 1. No acute vascular abnormality detected. Specifically, there is no dissection or pulmonary embolism. 2. Free fluid in the patient's abdomen presumably secondary to the patient's peritoneal dialysis catheter. However, there is a somewhat nodular appearance of the liver which raises suspicion for underlying cirrhosis. 3. Mild wall thickening of the gallbladder which is favored to be secondary to the presence of ascites. If there is clinical concern for acute cholecystitis follow-up with ultrasound is recommended. 4. Multiple scattered pulmonary nodules measuring up to approximately 4 mm as detailed above. No follow-up needed if patient is low-risk (and has no known or suspected primary neoplasm). Non-contrast chest CT can be considered in 12 months if patient is high-risk. This recommendation follows the consensus statement: Guidelines for Management of Incidental Pulmonary Nodules Detected on CT Images: From the Fleischner Society 2017; Radiology 2017; 284:228-243. 5. Atrophic right kidney.  No hydronephrosis.  Electronically Signed   By: Constance Holster M.D.   On: 11/30/2018 17:33    ____________________________________________   PROCEDURES .Critical Care Performed by: Carrie Mew, MD Authorized by: Carrie Mew, MD   Critical care provider statement:    Critical care time (minutes):  35   Critical care time was exclusive of:  Separately billable procedures and treating other patients   Critical care was necessary to treat or prevent imminent or life-threatening deterioration of the following conditions:  Cardiac failure and circulatory failure   Critical care was time spent personally by me on the following activities:  Development of treatment plan with patient or surrogate, discussions with consultants, evaluation of patient's response to treatment, examination of patient, obtaining history from patient or surrogate, ordering and performing treatments and interventions, ordering and review of laboratory studies, ordering and review of radiographic studies, pulse oximetry, re-evaluation of patient's condition and review of old charts    ____________________________________________  DIFFERENTIAL DIAGNOSIS/MDM   Non-STEMI, unstable angina, aortic dissection, carditis.  Doubt PE pneumothorax pneumonia pulmonary edema pleural effusion.  Patient is not septic on initial assessment  CLINICAL IMPRESSION / ASSESSMENT AND PLAN / ED COURSE  Medications ordered in the ED: Medications  heparin bolus via infusion 4,000 Units (has no administration in time range)  heparin ADULT infusion 100 units/mL (25000 units/241mL sodium chloride 0.45%) (has no administration in time range)  iopamidol (ISOVUE-370) 76 % injection 100 mL (100 mLs Intravenous Contrast Given 11/30/18 1702)    Pertinent labs & imaging results that were available during my care of the patient were reviewed by me and considered in my medical decision making (see chart for details).  Todd Barr was evaluated in  Emergency Department on 11/30/2018 for the symptoms described in the history of present illness. He was evaluated in the context of the global COVID-19 pandemic, which necessitated consideration that the patient might be at risk for infection with the SARS-CoV-2 virus that causes COVID-19. Institutional protocols and algorithms that pertain to the evaluation of patients at risk for COVID-19 are in a state of rapid change based on information released by regulatory bodies including the CDC and federal and state organizations. These policies and algorithms were followed during the patient's care in the ED.     Clinical Course as of Nov 29 1829  Tue Nov 30, 2018  1613 Patient presents with exertional chest pain radiating to the left leg, worsening for the past week.  Associated with left leg heaviness and relative weakness.  On exam he is neurologically intact, symmetric pulses, good distal  perfusion.  There is pain in the left leg with active range of motion causing effort limitation, no pain with passive range of motion.  High suspicion for aortic dissection, will order CT angiogram.  Has been on dialysis for a year IV contrast should not be an issue. If CTA negative, will plan to start heparin and admit for usntable angina. Currently no pain at rest in tx bed.    [PS]  1719 Chronic baseline anemia  Hemoglobin(!): 8.6 [PS]    Clinical Course User Index [PS] Carrie Mew, MD   ----------------------------------------- 6:31 PM on 11/30/2018 ----------------------------------------- CTA negative. Initial labs unremarkable.  Start heparin for unstable angina.. D/w hospitalist for further mgmt.  ____________________________________________   FINAL CLINICAL IMPRESSION(S) / ED DIAGNOSES    Final diagnoses:  Unstable angina pectoris [facility])     ED Discharge Orders    None      Portions of this note were generated with dragon dictation software. Dictation errors may occur despite best  attempts at proofreading.   Carrie Mew, MD 11/30/18 [phone]

## 2018-12-01 ENCOUNTER — Encounter
Admission: EM | Disposition: A | Discharge status: Home / Self Care | Payer: Self-pay | Source: Home / Self Care | Attending: Internal Medicine

## 2018-12-01 ENCOUNTER — Other Ambulatory Visit: Payer: Self-pay

## 2018-12-01 HISTORY — PX: LEFT HEART CATH AND CORONARY ANGIOGRAPHY: CATH118249

## 2018-12-01 LAB — LIPID PANEL
Cholesterol: 103 mg/dL (ref 0–200)
HDL: 26 mg/dL — ABNORMAL LOW (ref >40)
LDL Cholesterol: 47 mg/dL (ref 0–99)
Total CHOL/HDL Ratio: 4 RATIO
Triglycerides: 150 mg/dL — ABNORMAL HIGH (ref <150)
VLDL: 30 mg/dL (ref 0–40)

## 2018-12-01 LAB — CBC
HCT: 23.6 % — ABNORMAL LOW (ref 39.0–52.0)
Hemoglobin: 8.1 g/dL — ABNORMAL LOW (ref 13.0–17.0)
MCH: 31.4 pg (ref 26.0–34.0)
MCHC: 34.3 g/dL (ref 30.0–36.0)
MCV: 91.5 fL (ref 80.0–100.0)
Platelets: 132 10*3/uL — ABNORMAL LOW (ref 150–400)
RBC: 2.58 MIL/uL — ABNORMAL LOW (ref 4.22–5.81)
RDW: 13.1 % (ref 11.5–15.5)
WBC: 9.4 10*3/uL (ref 4.0–10.5)
nRBC: 0 % (ref 0.0–0.2)

## 2018-12-01 LAB — GLUCOSE, CAPILLARY
Glucose-Capillary: 255 mg/dL — ABNORMAL HIGH (ref 70–99)
Glucose-Capillary: 200 mg/dL — ABNORMAL HIGH (ref 70–99)
Glucose-Capillary: 164 mg/dL — ABNORMAL HIGH (ref 70–99)
Glucose-Capillary: 171 mg/dL — ABNORMAL HIGH (ref 70–99)
Glucose-Capillary: 153 mg/dL — ABNORMAL HIGH (ref 70–99)
Glucose-Capillary: 242 mg/dL — ABNORMAL HIGH (ref 70–99)
Glucose-Capillary: 249 mg/dL — ABNORMAL HIGH (ref 70–99)

## 2018-12-01 LAB — T4, FREE: Free T4: 1 ng/dL (ref 0.61–1.12)

## 2018-12-01 LAB — BASIC METABOLIC PANEL
Anion gap: 12 (ref 5–15)
BUN: 56 mg/dL — ABNORMAL HIGH (ref 8–23)
CO2: 22 mmol/L (ref 22–32)
Calcium: 7.8 mg/dL — ABNORMAL LOW (ref 8.9–10.3)
Chloride: 99 mmol/L (ref 98–111)
Creatinine, Ser: 7.24 mg/dL — ABNORMAL HIGH (ref 0.61–1.24)
GFR calc Af Amer: 8 mL/min — ABNORMAL LOW (ref >60)
GFR calc non Af Amer: 7 mL/min — ABNORMAL LOW (ref >60)
Glucose, Bld: 262 mg/dL — ABNORMAL HIGH (ref 70–99)
Potassium: 2.8 mmol/L — ABNORMAL LOW (ref 3.5–5.1)
Sodium: 133 mmol/L — ABNORMAL LOW (ref 135–145)

## 2018-12-01 LAB — TSH: TSH: 7.732 u[IU]/mL — ABNORMAL HIGH (ref 0.350–4.500)

## 2018-12-01 LAB — HEMOGLOBIN A1C
Hgb A1c MFr Bld: 8.4 % — ABNORMAL HIGH (ref 4.8–5.6)
Mean Plasma Glucose: 194.38 mg/dL

## 2018-12-01 LAB — HEPARIN LEVEL (UNFRACTIONATED)
Heparin Unfractionated: 0.33 IU/mL (ref 0.30–0.70)
Heparin Unfractionated: 0.2 IU/mL — ABNORMAL LOW (ref 0.30–0.70)

## 2018-12-01 LAB — POTASSIUM: Potassium: 3.4 mmol/L — ABNORMAL LOW (ref 3.5–5.1)

## 2018-12-01 LAB — GLUCOSE, RANDOM: Glucose, Bld: 190 mg/dL — ABNORMAL HIGH (ref 70–99)

## 2018-12-01 LAB — MAGNESIUM: Magnesium: 1.2 mg/dL — ABNORMAL LOW (ref 1.7–2.4)

## 2018-12-01 SURGERY — LEFT HEART CATH AND CORONARY ANGIOGRAPHY
Anesthesia: Moderate Sedation

## 2018-12-01 MED ORDER — HEPARIN (PORCINE) IN NACL 1000-0.9 UT/500ML-% IV SOLN
INTRAVENOUS | Status: AC
  Filled 2018-12-01: qty 1000

## 2018-12-01 MED ORDER — AMLODIPINE BESYLATE 10 MG PO TABS
10.0000 mg | ORAL_TABLET | Freq: Every day | ORAL | Status: DC
  Administered 2018-12-01 – 2018-12-02 (×2): 10 mg via ORAL
  Filled 2018-12-01 (×2): qty 1

## 2018-12-01 MED ORDER — ISOSORBIDE MONONITRATE ER 60 MG PO TB24
60.0000 mg | ORAL_TABLET | ORAL | Status: DC
  Administered 2018-12-01 – 2018-12-02 (×2): 60 mg via ORAL
  Filled 2018-12-01 (×2): qty 1

## 2018-12-01 MED ORDER — POTASSIUM CHLORIDE 10 MEQ/100ML IV SOLN
10.0000 meq | INTRAVENOUS | Status: AC
  Administered 2018-12-01 (×2): 10 meq via INTRAVENOUS
  Filled 2018-12-01 (×3): qty 100

## 2018-12-01 MED ORDER — SODIUM CHLORIDE 0.9 % IV SOLN: 250.0000 mL | INTRAVENOUS | Status: DC | PRN

## 2018-12-01 MED ORDER — NITROGLYCERIN 0.4 MG SL SUBL: 0.4000 mg | SUBLINGUAL_TABLET | SUBLINGUAL | Status: DC | PRN

## 2018-12-01 MED ORDER — ONDANSETRON HCL 4 MG/2ML IJ SOLN: 4.0000 mg | Freq: Four times a day (QID) | INTRAMUSCULAR | Status: DC | PRN

## 2018-12-01 MED ORDER — MIDAZOLAM HCL 2 MG/2ML IJ SOLN
INTRAMUSCULAR | Status: AC
  Filled 2018-12-01: qty 2

## 2018-12-01 MED ORDER — FERROUS SULFATE 325 (65 FE) MG PO TABS
325.0000 mg | ORAL_TABLET | Freq: Three times a day (TID) | ORAL | Status: DC
  Administered 2018-12-01 – 2018-12-02 (×3): 325 mg via ORAL
  Filled 2018-12-01 (×5): qty 1

## 2018-12-01 MED ORDER — NEPRO/CARBSTEADY PO LIQD
237.0000 mL | Freq: Two times a day (BID) | ORAL | Status: DC
  Administered 2018-12-02: 237 mL via ORAL

## 2018-12-01 MED ORDER — PANTOPRAZOLE SODIUM 40 MG PO TBEC
40.0000 mg | DELAYED_RELEASE_TABLET | Freq: Every day | ORAL | Status: DC
  Administered 2018-12-01 – 2018-12-02 (×2): 40 mg via ORAL
  Filled 2018-12-01 (×2): qty 1

## 2018-12-01 MED ORDER — HYDRALAZINE HCL 50 MG PO TABS
50.0000 mg | ORAL_TABLET | Freq: Three times a day (TID) | ORAL | Status: DC
  Administered 2018-12-01 – 2018-12-02 (×4): 50 mg via ORAL
  Filled 2018-12-01 (×4): qty 1

## 2018-12-01 MED ORDER — IOHEXOL 300 MG/ML  SOLN
INTRAMUSCULAR | Status: DC | PRN
  Administered 2018-12-01: 17:00:00 130 mL via INTRA_ARTERIAL

## 2018-12-01 MED ORDER — HEPARIN SODIUM (PORCINE) 1000 UNIT/ML IJ SOLN
INTRAMUSCULAR | Status: AC
  Filled 2018-12-01: qty 1

## 2018-12-01 MED ORDER — LABETALOL HCL 5 MG/ML IV SOLN: 10.0000 mg | INTRAVENOUS | Status: AC | PRN

## 2018-12-01 MED ORDER — POTASSIUM CHLORIDE 20 MEQ PO PACK
20.0000 meq | PACK | Freq: Every day | ORAL | Status: DC
  Administered 2018-12-01 – 2018-12-02 (×2): 20 meq via ORAL
  Filled 2018-12-01 (×2): qty 1

## 2018-12-01 MED ORDER — LABETALOL HCL 5 MG/ML IV SOLN
INTRAVENOUS | Status: AC
  Filled 2018-12-01: qty 4

## 2018-12-01 MED ORDER — CALCITRIOL 0.25 MCG PO CAPS
0.2500 ug | ORAL_CAPSULE | ORAL | Status: DC
  Administered 2018-12-01: 0.25 ug via ORAL
  Filled 2018-12-01: qty 1

## 2018-12-01 MED ORDER — SIMETHICONE 80 MG PO CHEW
80.0000 mg | CHEWABLE_TABLET | Freq: Three times a day (TID) | ORAL | Status: DC | PRN
  Filled 2018-12-01: qty 1

## 2018-12-01 MED ORDER — CLOPIDOGREL BISULFATE 75 MG PO TABS
75.0000 mg | ORAL_TABLET | Freq: Every day | ORAL | Status: DC
  Administered 2018-12-01 – 2018-12-02 (×2): 75 mg via ORAL
  Filled 2018-12-01 (×2): qty 1

## 2018-12-01 MED ORDER — HEPARIN (PORCINE) IN NACL 1000-0.9 UT/500ML-% IV SOLN
INTRAVENOUS | Status: DC | PRN
  Administered 2018-12-01: 500 mL

## 2018-12-01 MED ORDER — HEPARIN BOLUS VIA INFUSION
1500.0000 [IU] | Freq: Once | INTRAVENOUS | Status: AC
  Administered 2018-12-01: 1500 [IU] via INTRAVENOUS
  Filled 2018-12-01: qty 1500

## 2018-12-01 MED ORDER — SODIUM CHLORIDE 0.9% FLUSH: 3.0000 mL | INTRAVENOUS | Status: DC | PRN

## 2018-12-01 MED ORDER — HYDRALAZINE HCL 20 MG/ML IJ SOLN: 10.0000 mg | INTRAMUSCULAR | Status: AC | PRN

## 2018-12-01 MED ORDER — MIDAZOLAM HCL 2 MG/2ML IJ SOLN
INTRAMUSCULAR | Status: DC | PRN
  Administered 2018-12-01: 1 mg via INTRAVENOUS

## 2018-12-01 MED ORDER — SODIUM BICARBONATE 650 MG PO TABS
650.0000 mg | ORAL_TABLET | Freq: Two times a day (BID) | ORAL | Status: DC
  Filled 2018-12-01: qty 1

## 2018-12-01 MED ORDER — POTASSIUM CHLORIDE 10 MEQ/100ML IV SOLN
10.0000 meq | INTRAVENOUS | Status: DC
  Filled 2018-12-01 (×3): qty 100

## 2018-12-01 MED ORDER — FENTANYL CITRATE (PF) 100 MCG/2ML IJ SOLN
INTRAMUSCULAR | Status: DC | PRN
  Administered 2018-12-01: 50 ug via INTRAVENOUS

## 2018-12-01 MED ORDER — CALCIUM ACETATE (PHOS BINDER) 667 MG PO CAPS
1334.0000 mg | ORAL_CAPSULE | Freq: Two times a day (BID) | ORAL | Status: DC
  Administered 2018-12-01 – 2018-12-02 (×3): 1334 mg via ORAL
  Filled 2018-12-01 (×4): qty 2

## 2018-12-01 MED ORDER — SODIUM CHLORIDE 0.9% FLUSH
3.0000 mL | Freq: Two times a day (BID) | INTRAVENOUS | Status: DC
  Administered 2018-12-02: 3 mL via INTRAVENOUS

## 2018-12-01 MED ORDER — ASPIRIN 81 MG PO CHEW
81.0000 mg | CHEWABLE_TABLET | Freq: Every day | ORAL | Status: DC
  Administered 2018-12-01 – 2018-12-02 (×2): 81 mg via ORAL
  Filled 2018-12-01 (×2): qty 1

## 2018-12-01 MED ORDER — FAMOTIDINE 20 MG PO TABS
20.0000 mg | ORAL_TABLET | Freq: Every day | ORAL | Status: DC
  Administered 2018-12-01 – 2018-12-02 (×2): 20 mg via ORAL
  Filled 2018-12-01 (×2): qty 1

## 2018-12-01 MED ORDER — FENTANYL CITRATE (PF) 100 MCG/2ML IJ SOLN
INTRAMUSCULAR | Status: AC
  Filled 2018-12-01: qty 2

## 2018-12-01 MED ORDER — POTASSIUM CHLORIDE 10 MEQ/100ML IV SOLN
10.0000 meq | INTRAVENOUS | Status: DC
  Filled 2018-12-01 (×4): qty 100

## 2018-12-01 MED ORDER — ATORVASTATIN CALCIUM 80 MG PO TABS
80.0000 mg | ORAL_TABLET | Freq: Every day | ORAL | Status: DC
  Administered 2018-12-01: 80 mg via ORAL
  Filled 2018-12-01: qty 1

## 2018-12-01 MED ORDER — MAGNESIUM OXIDE 400 (241.3 MG) MG PO TABS
400.0000 mg | ORAL_TABLET | Freq: Every day | ORAL | Status: DC
  Administered 2018-12-02: 400 mg via ORAL
  Filled 2018-12-01 (×2): qty 1

## 2018-12-01 MED ORDER — METOPROLOL SUCCINATE ER 100 MG PO TB24
100.0000 mg | ORAL_TABLET | Freq: Every day | ORAL | Status: DC
  Administered 2018-12-01 – 2018-12-02 (×2): 100 mg via ORAL
  Filled 2018-12-01 (×2): qty 1

## 2018-12-01 MED ORDER — ACETAMINOPHEN 325 MG PO TABS: 650.0000 mg | ORAL_TABLET | ORAL | Status: DC | PRN

## 2018-12-01 MED ORDER — DELFLEX-LC/2.5% DEXTROSE 394 MOSM/L IP SOLN
INTRAPERITONEAL | Status: DC
  Filled 2018-12-01 (×2): qty 3000

## 2018-12-01 MED ORDER — RENA-VITE PO TABS
1.0000 | ORAL_TABLET | Freq: Every day | ORAL | Status: DC
  Administered 2018-12-02: 1 via ORAL
  Filled 2018-12-01: qty 1

## 2018-12-01 MED ORDER — ASPIRIN 81 MG PO CHEW: 81.0000 mg | CHEWABLE_TABLET | ORAL | Status: DC

## 2018-12-01 MED ORDER — SODIUM CHLORIDE 0.9 % WEIGHT BASED INFUSION
1.0000 mL/kg/h | INTRAVENOUS | Status: AC
  Administered 2018-12-01 (×2): 1 mL/kg/h via INTRAVENOUS

## 2018-12-01 MED ORDER — VERAPAMIL HCL 2.5 MG/ML IV SOLN
INTRAVENOUS | Status: AC
  Filled 2018-12-01: qty 2

## 2018-12-01 MED ORDER — POTASSIUM CHLORIDE 20 MEQ PO PACK: 20.0000 meq | PACK | Freq: Once | ORAL | Status: DC

## 2018-12-01 MED ORDER — MAGNESIUM SULFATE 2 GM/50ML IV SOLN
2.0000 g | Freq: Once | INTRAVENOUS | Status: AC
  Administered 2018-12-01: 2 g via INTRAVENOUS
  Filled 2018-12-01: qty 50

## 2018-12-01 MED ORDER — POTASSIUM CHLORIDE 10 MEQ/100ML IV SOLN
10.0000 meq | Freq: Once | INTRAVENOUS | Status: AC
  Administered 2018-12-01: 10 meq via INTRAVENOUS
  Filled 2018-12-01: qty 100

## 2018-12-01 MED ORDER — LABETALOL HCL 5 MG/ML IV SOLN
INTRAVENOUS | Status: DC | PRN
  Administered 2018-12-01 (×2): 10 mg via INTRAVENOUS

## 2018-12-01 MED ORDER — FUROSEMIDE 10 MG/ML IJ SOLN
80.0000 mg | Freq: Once | INTRAMUSCULAR | Status: AC
  Administered 2018-12-01: 80 mg via INTRAVENOUS
  Filled 2018-12-01: qty 8

## 2018-12-01 MED ORDER — SODIUM CHLORIDE 0.9 % IV SOLN
INTRAVENOUS | Status: DC
  Administered 2018-12-01 (×2): via INTRAVENOUS

## 2018-12-01 SURGICAL SUPPLY — 11 items
CATH INFINITI 5FR ANG PIGTAIL (CATHETERS) ×3 IMPLANT
CATH INFINITI 5FR JL4 (CATHETERS) ×3 IMPLANT
CATH INFINITI JR4 5F (CATHETERS) ×3 IMPLANT
DEVICE CLOSURE MYNXGRIP 5F (Vascular Products) ×3 IMPLANT
GLIDESHEATH SLEND SS 6F .021 (SHEATH) IMPLANT
KIT MANI 3VAL PERCEP (MISCELLANEOUS) ×3 IMPLANT
NEEDLE PERC 18GX7CM (NEEDLE) ×3 IMPLANT
PACK CARDIAC CATH (CUSTOM PROCEDURE TRAY) ×3 IMPLANT
SHEATH AVANTI 5FR X 11CM (SHEATH) ×3 IMPLANT
WIRE GUIDERIGHT .035X150 (WIRE) ×3 IMPLANT
WIRE ROSEN-J .035X260CM (WIRE) IMPLANT

## 2018-12-01 NOTE — Progress Notes (Signed)
[facility] NAME: Todd Barr    MR#:  [id number]  DATE OF BIRTH:  [birth year]  SUBJECTIVE:   patient going for cardiac cath this am Denies chest pain  REVIEW OF SYSTEMS:    Review of Systems  Constitutional: Negative for fever, chills weight loss HENT: Negative for ear pain, nosebleeds, congestion, facial swelling, rhinorrhea, neck pain, neck stiffness and ear discharge.   Respiratory: Negative for cough, shortness of breath, wheezing  Cardiovascular: Negative for chest pain, palpitations and leg swelling.  Gastrointestinal: Negative for heartburn, abdominal pain, vomiting, diarrhea or consitpation Genitourinary: Negative for dysuria, urgency, frequency, hematuria Musculoskeletal: Negative for back pain or joint pain Neurological: Negative for dizziness, seizures, syncope, focal weakness,  numbness and headaches.  Hematological: Does not bruise/bleed easily.  Psychiatric/Behavioral: Negative for hallucinations, confusion, dysphoric mood    Tolerating Diet: NPO      DRUG ALLERGIES:   Allergies  Allergen Reactions  . Buchu-Cornsilk-Ch Grass-Hydran     Other reaction(s): Unknown  . Codeine Other (See Comments)    headaches  . Sulfabenzamide Other (See Comments)    High fever  . Thiazide-Type Diuretics Other (See Comments)    VITALS:  Blood pressure (!) 156/72, pulse 94, temperature 98.2 F (36.8 C), temperature source Oral, resp. rate 17, height 5\' 10"  (1.778 m), weight 91.8 kg, SpO2 96 %.  PHYSICAL EXAMINATION:  Constitutional: Appears well-developed and well-nourished. No distress. HENT: Normocephalic. Marland Kitchen Oropharynx is clear and moist.  Eyes: Conjunctivae and EOM are normal. PERRLA, no scleral icterus.  Neck: Normal ROM. Neck supple. No JVD. No tracheal deviation. CVS: RRR, S1/S2 +, no murmurs, no gallops, no carotid bruit.  Pulmonary: Effort and breath sounds normal, no stridor, rhonchi, wheezes, rales.   Abdominal: Soft. BS +,  no distension, tenderness, rebound or guarding.  Musculoskeletal: Normal range of motion. No edema and no tenderness.  Neuro: Alert. CN 2-12 grossly intact. No focal deficits. Skin: Skin is warm and dry. No rash noted. Psychiatric: Normal mood and affect.      LABORATORY PANEL:   CBC Recent Labs  Lab 12/01/18 0303  WBC 9.4  HGB 8.1*  HCT 23.6*  PLT 132*   ------------------------------------------------------------------------------------------------------------------  Chemistries  Recent Labs  Lab 12/01/18 0303 12/01/18 0937  NA 133*  --   K 2.8*  --   CL 99  --   CO2 22  --   GLUCOSE 262* 190*  BUN 56*  --   CREATININE 7.24*  --   CALCIUM 7.8*  --   MG  --  1.2*   ------------------------------------------------------------------------------------------------------------------  Cardiac Enzymes No results for input(s): TROPONINI in the last 168 hours. ------------------------------------------------------------------------------------------------------------------  RADIOLOGY:  Dg Chest 2 View  Result Date: 11/30/2018 CLINICAL DATA:  Chest pain EXAM: CHEST - 2 VIEW COMPARISON:  None. FINDINGS: Cardiac shadows within normal limits. The lungs are clear bilaterally. Scattered calcified granulomas are seen. Degenerative changes of the thoracic spine are noted. IMPRESSION: No active cardiopulmonary disease. Electronically Signed   By: Inez Catalina M.D.   On: 11/30/2018 15:30   US Venous Img Lower Unilateral Left  Result Date: 11/30/2018 CLINICAL DATA:  73 year old male with heaviness and swelling of the left leg. EXAM: LEFT LOWER EXTREMITY VENOUS DOPPLER ULTRASOUND TECHNIQUE: Gray-scale sonography with graded compression, as well as color Doppler and duplex ultrasound were performed to evaluate the lower extremity deep venous systems from the level of the common femoral vein and including the common femoral, femoral, profunda femoral,  popliteal  and calf veins including the posterior tibial, peroneal and gastrocnemius veins when visible. The superficial great saphenous vein was also interrogated. Spectral Doppler was utilized to evaluate flow at rest and with distal augmentation maneuvers in the common femoral, femoral and popliteal veins. COMPARISON:  None. FINDINGS: Contralateral Common Femoral Vein: Respiratory phasicity is normal and symmetric with the symptomatic side. No evidence of thrombus. Normal compressibility. Common Femoral Vein: No evidence of thrombus. Normal compressibility, respiratory phasicity and response to augmentation. Saphenofemoral Junction: No evidence of thrombus. Normal compressibility and flow on color Doppler imaging. Profunda Femoral Vein: No evidence of thrombus. Normal compressibility and flow on color Doppler imaging. Femoral Vein: No evidence of thrombus. Normal compressibility, respiratory phasicity and response to augmentation. Popliteal Vein: No evidence of thrombus. Normal compressibility, respiratory phasicity and response to augmentation. Calf Veins: No evidence of thrombus. Normal compressibility and flow on color Doppler imaging. Superficial Great Saphenous Vein: No evidence of thrombus. Normal compressibility. Venous Reflux:  None. Other Findings:  None. IMPRESSION: No evidence of deep venous thrombosis. Electronically Signed   By: Jacqulynn Cadet M.D.   On: 11/30/2018 16:02   Ct Angio Chest/abd/pel For Dissection W And/or Wo Contrast  Result Date: 11/30/2018 CLINICAL DATA:  Aortic disease.  Intermittent left-sided chest pain EXAM: CT ANGIOGRAPHY CHEST, ABDOMEN AND PELVIS TECHNIQUE: Multidetector CT imaging through the chest, abdomen and pelvis was performed using the standard protocol during bolus administration of intravenous contrast. Multiplanar reconstructed images and MIPs were obtained and reviewed to evaluate the vascular anatomy. CONTRAST:  153mL ISOVUE-370 IOPAMIDOL (ISOVUE-370) INJECTION 76%  COMPARISON:  None. FINDINGS: CTA CHEST FINDINGS Cardiovascular: Contrast injection is sufficient to demonstrate satisfactory opacification of the pulmonary arteries to the segmental level. There is no pulmonary embolus. The main pulmonary artery is within normal limits for size. There is no CT evidence of acute right heart strain. There is no evidence for a dissection. Aortic calcifications are noted. Coronary artery calcifications are noted. There is likely coronary artery stent. Heart size is normal. There is no significant pericardial effusion. Mediastinum/Nodes: --No mediastinal or hilar lymphadenopathy. --No axillary lymphadenopathy. --No supraclavicular lymphadenopathy. --Normal thyroid gland. --The esophagus is unremarkable Lungs/Pleura: There is a 4 mm pulmonary nodule in the right lower lobe (axial series 7, image 112). There is an additional 3 mm pulmonary nodule in the right lower lobe (axial series 7, image 126). There is a 4 mm pulmonary nodule in the left lower lobe (axial series 7, image 64). The trachea is grossly unremarkable. There is no pneumothorax. No significant pleural effusion. Musculoskeletal: There is a posterior sebaceous cyst measuring approximately 2.8 cm. There is no displaced fracture. Review of the MIP images confirms the above findings. CTA ABDOMEN AND PELVIS FINDINGS VASCULAR Aorta: Normal caliber aorta without aneurysm, dissection, vasculitis or significant stenosis. Celiac: Patent without evidence of aneurysm, dissection, vasculitis or significant stenosis. SMA: There is some mild to moderate narrowing of the proximal SMA. The distal SMA is grossly unremarkable. Renals: There is mild-to-moderate narrowing of the proximal left renal artery. There is minimal narrowing at the proximal right renal artery. There are 2 right renal arteries. The more inferior renal artery also demonstrates some significant narrowing. IMA: Patent without evidence of aneurysm, dissection, vasculitis or  significant stenosis. Inflow: Patent without evidence of aneurysm, dissection, vasculitis or significant stenosis. There is mild narrowing at the origin of the left SFA. Veins: No obvious venous abnormality within the limitations of this arterial phase study. Review of the MIP images confirms the above  findings. NON-VASCULAR Hepatobiliary: The liver demonstrates a somewhat nodular contour. There is mild gallbladder wall thickening which is likely secondary to the presence of free fluid.There is no biliary ductal dilation. Pancreas: Normal contours without ductal dilatation. No peripancreatic fluid collection. Spleen: No splenic laceration or hematoma. Adrenals/Urinary Tract: --Adrenal glands: No adrenal hemorrhage. 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 --Left kidney/ureter: No hydronephrosis or perinephric hematoma. --Urinary bladder: Unremarkable. Stomach/Bowel: --Stomach/Duodenum: No hiatal hernia or other gastric abnormality. Normal duodenal course and caliber. --Small bowel: No dilatation or inflammation. --Colon: No focal abnormality. --Appendix: Normal. Lymphatic: --No retroperitoneal lymphadenopathy. --No mesenteric lymphadenopathy. --No pelvic or inguinal lymphadenopathy. Reproductive: Unremarkable Other: There is a moderate amount of free fluid in the patient's abdomen. There is a catheter coursing through the anterior right abdominal wall and terminating in the left mid abdomen. This is favored to represent the patient's peritoneal dialysis catheter. There is no free air. There are a few areas of fat stranding in the anterior abdominal wall, left greater than right. This may represent sites of subcutaneous injections. There is no significant abdominal wall hernia. Musculoskeletal. No acute displaced fractures. Review of the MIP images confirms the above findings. IMPRESSION: 1. No acute vascular abnormality detected. Specifically, there is no dissection or pulmonary  embolism. 2. Free fluid in the patient's abdomen presumably secondary to the patient's peritoneal dialysis catheter. However, there is a somewhat nodular appearance of the liver which raises suspicion for underlying cirrhosis. 3. Mild wall thickening of the gallbladder which is favored to be secondary to the presence of ascites. If there is clinical concern for acute cholecystitis follow-up with ultrasound is recommended. 4. Multiple scattered pulmonary nodules measuring up to approximately 4 mm as detailed above. No follow-up needed if patient is low-risk (and has no known or suspected primary neoplasm). Non-contrast chest CT can be considered in 12 months if patient is high-risk. This recommendation follows the consensus statement: Guidelines for Management of Incidental Pulmonary Nodules Detected on CT Images: From the Fleischner Society 2017; Radiology 2017; 284:228-243. 5. Atrophic right kidney.  No hydronephrosis. Electronically Signed   By: Constance Holster M.D.   On: 11/30/2018 17:33     ASSESSMENT AND PLAN:   72 year old male with end-stage renal disease on peritoneal dialysis and diabetes who presented to the emergency room due to chest pain.   1. Unstable angina: Patient going for cath this afternoon D/w Dr Clayborn Bigness Continue heparin gtt, ASA, plavix, Metoprolol,atorvastatin  2.  Essential hypertension: Continue Norvasc, hydralazine, isosorbide and metoprolol.  3.  Diabetes: Continue sliding scale  4. HLD: Continue Lipitor  Management plans discussed with the patient and he is in agreement.  CODE STATUS: full  TOTAL TIME TAKING CARE OF THIS PATIENT: 30 minutes.     POSSIBLE D/C 1-2 days, DEPENDING ON CLINICAL CONDITION.   Bettey Costa M.D on 12/01/2018 at 11:44 AM  Between 7am to 6pm - Pager - [phone] After 6pm go to [email or web address] - password [email or web address] [facility]  Office  [phone]  CC: Primary care physician; System, Pcp Not In  Note: This  dictation was prepared with Dragon dictation along with smaller phrase technology. Any transcriptional errors that result from this process are unintentional.

## 2018-12-01 NOTE — Progress Notes (Addendum)
CBG at 0828 was 200.  Patient is refusing insulin.  He says standard glucometers record CBG's higher than is glucose actually is.  He says he has a special glucometer that [facility] provided him that gives accurate reading.  Lab was drawing an INR, I added a serum glucose to the draw.   1145:  Serum glucose came back at 190.  The glucometer reading was correct.  Patient is in Cardiac Cath.

## 2018-12-01 NOTE — Progress Notes (Signed)
Inpatient Diabetes Program Recommendations  AACE/ADA: New Consensus Statement on Inpatient Glycemic Control (2015)  Target Ranges:  Prepandial:   less than 140 mg/dL      Peak postprandial:   less than 180 mg/dL (1-2 hours)      Critically ill patients:  140 - 180 mg/dL   Lab Results  Component Value Date   GLUCAP 200 (H) 12/01/2018   HGBA1C 8.4 (H) 12/01/2018    Review of Glycemic Control Results for Todd Barr, Todd Barr (MRN [id number]) as of 12/01/2018 10:46  Ref. Range 11/30/2018 20:29 11/30/2018 21:34 12/01/2018 03:01 12/01/2018 08:28  Glucose-Capillary Latest Ref Range: 70 - 99 mg/dL 68 (L) 91 255 (H) 200 (H)   Diabetes history: DM 2 Outpatient Diabetes medications:  Lantus 25 units bid Novolin R 15 units tid with meals Current orders for Inpatient glycemic control:  Novolog sensitive tid with meals and HS Inpatient Diabetes Program Recommendations:    Please consider adding Lantus 20 units daily.   Thanks,  Adah Perl, RN, BC-ADM Inpatient Diabetes Coordinator Pager [phone]

## 2018-12-01 NOTE — Progress Notes (Signed)
[facility]  ROUNDING NOTE   Subjective:   Todd Barr admitted to [facility] on 11/30/2018 for Unstable angina pectoris [facility]) [I20.0]  Patient was seen in dialysis clinic by my partner on 8/18 (yesterday) where patient reported that he was having chest pain on exertion for over 1 week.   Peritoneal dialysis last night, mix of 1.5% and 2.5% dextrose. Tolerated treatment well.   Objective:  Vital signs in last 24 hours:  Temp:  [98 F (36.7 C)-98.4 F (36.9 C)] 98.2 F (36.8 C) (08/19 0758) Pulse Rate:  [82-110] 94 (08/19 0758) Resp:  [11-29] 17 (08/19 0758) BP: (153-201)/(67-90) 156/72 (08/19 0758) SpO2:  [92 %-99 %] 96 % (08/19 0758) Weight:  [91.2 kg-91.8 kg] 91.8 kg (08/19 0429)  Weight change:  Filed Weights   11/30/18 1511 12/01/18 0429  Weight: 91.2 kg 91.8 kg    Intake/Output: No intake/output data recorded.   Intake/Output this shift:  No intake/output data recorded.  Physical Exam: General: NAD, laying in bed  Head: Normocephalic, atraumatic. Moist oral mucosal membranes  Eyes: Anicteric, PERRL  Neck: Supple, trachea midline  Lungs:  Clear to auscultation  Heart: Regular rate and rhythm  Abdomen:  Soft, nontender,   Extremities:  no peripheral edema.  Neurologic: Nonfocal, moving all four extremities  Skin: No lesions  Access: Left AVF, PD catheter    Basic Metabolic Panel: Recent Labs  Lab 11/30/18 1448 12/01/18 0303 12/01/18 0937  NA 135 133*  --   K 3.0* 2.8*  --   CL 98 99  --   CO2 24 22  --   GLUCOSE 169* 262*  --   BUN 62* 56*  --   CREATININE 7.43* 7.24*  --   CALCIUM 8.1* 7.8*  --   MG  --   --  1.2*    Liver Function Tests: No results for input(s): AST, ALT, ALKPHOS, BILITOT, PROT, ALBUMIN in the last 168 hours. No results for input(s): LIPASE, AMYLASE in the last 168 hours. No results for input(s): AMMONIA in the last 168 hours.  CBC: Recent Labs  Lab 11/30/18 1448 12/01/18 0303  WBC 8.3 9.4  HGB 8.6* 8.1*   HCT 24.4* 23.6*  MCV 91.4 91.5  PLT 136* 132*    Cardiac Enzymes: No results for input(s): CKTOTAL, CKMB, CKMBINDEX, TROPONINI in the last 168 hours.  BNP: Invalid input(s): POCBNP  CBG: Recent Labs  Lab 11/30/18 2029 11/30/18 2134 12/01/18 0301 12/01/18 0828  GLUCAP 68* 91 255* 200*    Microbiology: Results for orders placed or performed during the hospital encounter of 11/30/18  SARS Coronavirus 2 [facility] order, Performed in [facility] hospital lab) Nasopharyngeal Nasopharyngeal Swab     Status: None   Collection Time: 11/30/18  6:47 PM   Specimen: Nasopharyngeal Swab  Result Value Ref Range Status   SARS Coronavirus 2 NEGATIVE NEGATIVE Final    Comment: (NOTE) If result is NEGATIVE SARS-CoV-2 target nucleic acids are NOT DETECTED. The SARS-CoV-2 RNA is generally detectable in upper and lower  respiratory specimens during the acute phase of infection. The lowest  concentration of SARS-CoV-2 viral copies this assay can detect is 250  copies / mL. A negative result does not preclude SARS-CoV-2 infection  and should not be used as the sole basis for treatment or other  patient management decisions.  A negative result may occur with  improper specimen collection / handling, submission of specimen other  than nasopharyngeal swab, presence of viral mutation(s) within the  areas targeted by this assay, and inadequate number of viral copies  (<250 copies / mL). A negative result must be combined with clinical  observations, patient history, and epidemiological information. If result is POSITIVE SARS-CoV-2 target nucleic acids are DETECTED. The SARS-CoV-2 RNA is generally detectable in upper and lower  respiratory specimens dur ing the acute phase of infection.  Positive  results are indicative of active infection with SARS-CoV-2.  Clinical  correlation with patient history and other diagnostic information is  necessary to determine patient infection status.  Positive  results do  not rule out bacterial infection or co-infection with other viruses. If result is PRESUMPTIVE POSTIVE SARS-CoV-2 nucleic acids MAY BE PRESENT.   A presumptive positive result was obtained on the submitted specimen  and confirmed on repeat testing.  While 2019 novel coronavirus  (SARS-CoV-2) nucleic acids may be present in the submitted sample  additional confirmatory testing may be necessary for epidemiological  and / or clinical management purposes  to differentiate between  SARS-CoV-2 and other Sarbecovirus currently known to infect humans.  If clinically indicated additional testing with an alternate test  methodology [phone]) is advised. The SARS-CoV-2 RNA is generally  detectable in upper and lower respiratory sp ecimens during the acute  phase of infection. The expected result is Negative. Fact Sheet for Patients:  StrictlyIdeas.no Fact Sheet for Healthcare Providers: BankingDealers.co.za This test is not yet approved or cleared by the [place] FDA and has been authorized for detection and/or diagnosis of SARS-CoV-2 by FDA under an Emergency Use Authorization (EUA).  This EUA will remain in effect (meaning this test can be used) for the duration of the COVID-19 declaration under Section 564(b)(1) of the Act, 21 U.S.C. section 360bbb-3(b)(1), unless the authorization is terminated or revoked sooner. Performed at [facility], [address] [place], [place] [address]     Coagulation Studies: Recent Labs    11/30/18 1448  LABPROT 14.8  INR 1.2    Urinalysis: No results for input(s): COLORURINE, LABSPEC, PHURINE, GLUCOSEU, HGBUR, BILIRUBINUR, KETONESUR, PROTEINUR, UROBILINOGEN, NITRITE, LEUKOCYTESUR in the last 72 hours.  Invalid input(s): APPERANCEUR    Imaging: Dg Chest 2 View  Result Date: 11/30/2018 CLINICAL DATA:  Chest pain EXAM: CHEST - 2 VIEW COMPARISON:  None. FINDINGS: Cardiac  shadows within normal limits. The lungs are clear bilaterally. Scattered calcified granulomas are seen. Degenerative changes of the thoracic spine are noted. IMPRESSION: No active cardiopulmonary disease. Electronically Signed   By: Inez Catalina M.D.   On: 11/30/2018 15:30   US Venous Img Lower Unilateral Left  Result Date: 11/30/2018 CLINICAL DATA:  73 year old male with heaviness and swelling of the left leg. EXAM: LEFT LOWER EXTREMITY VENOUS DOPPLER ULTRASOUND TECHNIQUE: Gray-scale sonography with graded compression, as well as color Doppler and duplex ultrasound were performed to evaluate the lower extremity deep venous systems from the level of the common femoral vein and including the common femoral, femoral, profunda femoral, popliteal and calf veins including the posterior tibial, peroneal and gastrocnemius veins when visible. The superficial great saphenous vein was also interrogated. Spectral Doppler was utilized to evaluate flow at rest and with distal augmentation maneuvers in the common femoral, femoral and popliteal veins. COMPARISON:  None. FINDINGS: Contralateral Common Femoral Vein: Respiratory phasicity is normal and symmetric with the symptomatic side. No evidence of thrombus. Normal compressibility. Common Femoral Vein: No evidence of thrombus. Normal compressibility, respiratory phasicity and response to augmentation. Saphenofemoral Junction: No evidence of thrombus. Normal compressibility and flow on color Doppler  imaging. Profunda Femoral Vein: No evidence of thrombus. Normal compressibility and flow on color Doppler imaging. Femoral Vein: No evidence of thrombus. Normal compressibility, respiratory phasicity and response to augmentation. Popliteal Vein: No evidence of thrombus. Normal compressibility, respiratory phasicity and response to augmentation. Calf Veins: No evidence of thrombus. Normal compressibility and flow on color Doppler imaging. Superficial Great Saphenous Vein: No  evidence of thrombus. Normal compressibility. Venous Reflux:  None. Other Findings:  None. IMPRESSION: No evidence of deep venous thrombosis. Electronically Signed   By: Jacqulynn Cadet M.D.   On: 11/30/2018 16:02   Ct Angio Chest/abd/pel For Dissection W And/or Wo Contrast  Result Date: 11/30/2018 CLINICAL DATA:  Aortic disease.  Intermittent left-sided chest pain EXAM: CT ANGIOGRAPHY CHEST, ABDOMEN AND PELVIS TECHNIQUE: Multidetector CT imaging through the chest, abdomen and pelvis was performed using the standard protocol during bolus administration of intravenous contrast. Multiplanar reconstructed images and MIPs were obtained and reviewed to evaluate the vascular anatomy. CONTRAST:  11mL ISOVUE-370 IOPAMIDOL (ISOVUE-370) INJECTION 76% COMPARISON:  None. FINDINGS: CTA CHEST FINDINGS Cardiovascular: Contrast injection is sufficient to demonstrate satisfactory opacification of the pulmonary arteries to the segmental level. There is no pulmonary embolus. The main pulmonary artery is within normal limits for size. There is no CT evidence of acute right heart strain. There is no evidence for a dissection. Aortic calcifications are noted. Coronary artery calcifications are noted. There is likely coronary artery stent. Heart size is normal. There is no significant pericardial effusion. Mediastinum/Nodes: --No mediastinal or hilar lymphadenopathy. --No axillary lymphadenopathy. --No supraclavicular lymphadenopathy. --Normal thyroid gland. --The esophagus is unremarkable Lungs/Pleura: There is a 4 mm pulmonary nodule in the right lower lobe (axial series 7, image 112). There is an additional 3 mm pulmonary nodule in the right lower lobe (axial series 7, image 126). There is a 4 mm pulmonary nodule in the left lower lobe (axial series 7, image 64). The trachea is grossly unremarkable. There is no pneumothorax. No significant pleural effusion. Musculoskeletal: There is a posterior sebaceous cyst measuring  approximately 2.8 cm. There is no displaced fracture. Review of the MIP images confirms the above findings. CTA ABDOMEN AND PELVIS FINDINGS VASCULAR Aorta: Normal caliber aorta without aneurysm, dissection, vasculitis or significant stenosis. Celiac: Patent without evidence of aneurysm, dissection, vasculitis or significant stenosis. SMA: There is some mild to moderate narrowing of the proximal SMA. The distal SMA is grossly unremarkable. Renals: There is mild-to-moderate narrowing of the proximal left renal artery. There is minimal narrowing at the proximal right renal artery. There are 2 right renal arteries. The more inferior renal artery also demonstrates some significant narrowing. IMA: Patent without evidence of aneurysm, dissection, vasculitis or significant stenosis. Inflow: Patent without evidence of aneurysm, dissection, vasculitis or significant stenosis. There is mild narrowing at the origin of the left SFA. Veins: No obvious venous abnormality within the limitations of this arterial phase study. Review of the MIP images confirms the above findings. NON-VASCULAR Hepatobiliary: The liver demonstrates a somewhat nodular contour. There is mild gallbladder wall thickening which is likely secondary to the presence of free fluid.There is no biliary ductal dilation. Pancreas: Normal contours without ductal dilatation. No peripancreatic fluid collection. Spleen: No splenic laceration or hematoma. Adrenals/Urinary Tract: --Adrenal glands: No adrenal hemorrhage. 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 --Left kidney/ureter: No hydronephrosis or perinephric hematoma. --Urinary bladder: Unremarkable. Stomach/Bowel: --Stomach/Duodenum: No hiatal hernia or other gastric abnormality. Normal duodenal course and caliber. --Small bowel: No dilatation or inflammation. --Colon: No focal  abnormality. --Appendix: Normal. Lymphatic: --No retroperitoneal lymphadenopathy. --No mesenteric  lymphadenopathy. --No pelvic or inguinal lymphadenopathy. Reproductive: Unremarkable Other: There is a moderate amount of free fluid in the patient's abdomen. There is a catheter coursing through the anterior right abdominal wall and terminating in the left mid abdomen. This is favored to represent the patient's peritoneal dialysis catheter. There is no free air. There are a few areas of fat stranding in the anterior abdominal wall, left greater than right. This may represent sites of subcutaneous injections. There is no significant abdominal wall hernia. Musculoskeletal. No acute displaced fractures. Review of the MIP images confirms the above findings. IMPRESSION: 1. No acute vascular abnormality detected. Specifically, there is no dissection or pulmonary embolism. 2. Free fluid in the patient's abdomen presumably secondary to the patient's peritoneal dialysis catheter. However, there is a somewhat nodular appearance of the liver which raises suspicion for underlying cirrhosis. 3. Mild wall thickening of the gallbladder which is favored to be secondary to the presence of ascites. If there is clinical concern for acute cholecystitis follow-up with ultrasound is recommended. 4. Multiple scattered pulmonary nodules measuring up to approximately 4 mm as detailed above. No follow-up needed if patient is low-risk (and has no known or suspected primary neoplasm). Non-contrast chest CT can be considered in 12 months if patient is high-risk. This recommendation follows the consensus statement: Guidelines for Management of Incidental Pulmonary Nodules Detected on CT Images: From the Fleischner Society 2017; Radiology 2017; 284:228-243. 5. Atrophic right kidney.  No hydronephrosis. Electronically Signed   By: Constance Holster M.D.   On: 11/30/2018 17:33     Medications:   . sodium chloride    . [START ON 12/02/2018] sodium chloride 10 mL/hr at 12/01/18 1028  . dialysis solution 1.5% low-MG/low-CA    . dialysis  solution 2.5% low-MG/low-CA    . heparin 1,200 Units/hr (12/01/18 1043)  . magnesium sulfate bolus IVPB    . potassium chloride 10 mEq (12/01/18 1039)  . potassium chloride     . amLODipine  10 mg Oral Daily  . aspirin  81 mg Oral Daily  . [START ON 12/02/2018] aspirin  81 mg Oral Pre-Cath  . atorvastatin  80 mg Oral QHS  . calcitRIOL  0.25 mcg Oral Q M,W,F  . calcium acetate  1,334 mg Oral BID WC  . clopidogrel  75 mg Oral Daily  . famotidine  20 mg Oral Daily  . ferrous sulfate  325 mg Oral TID WC  . gentamicin cream  1 application Topical Daily  . hydrALAZINE  50 mg Oral TID  . insulin aspart  0-5 Units Subcutaneous QHS  . insulin aspart  0-9 Units Subcutaneous TID WC  . isosorbide mononitrate  60 mg Oral BH-[id number]  . magnesium oxide  400 mg Oral Daily  . metoprolol  100 mg Oral Daily  . pantoprazole  40 mg Oral Daily  . potassium chloride  20 mEq Oral Daily  . sodium chloride flush  3 mL Intravenous Q12H   sodium chloride, acetaminophen, nitroGLYCERIN, ondansetron (ZOFRAN) IV, simethicone, sodium chloride flush  Assessment/ Plan:  Todd Barr is a 73 y.o. white male with end stage renal disease on peritoneal dialysis, hypertension, coronary artery disease status post stents, hyperlipidemia,  Diabetes mellitus type II, who [facility] on 11/30/2018 for Unstable angina pectoris ([place]) [I20.0]  [facility] 93.5 kg CCPD 8 hours 2.3 liter fills 4 exchanges with last fill of 1.2 liters of icodextran.   1.  End Stage Renal Disease: on peritoneal dialysis treatment last night. Tolerated treatment well. No ultrafiltration.  - Peritoneal dialysis for tonight. Orders prepared. 2.5% dextrose.   2. Hypokalemia - IV and PO potassium replacement - Replace magnesium - Liberalize diet once patient is able to eat.   3. Hypertension:  Elevated 156/72. Home regimen of amlodipine, hydralazine, isosorbide mononitrate, furosemide, losartan and metoprolol.   4.  Anemia of chronic kidney disease: hemoglobin 8.1. Gets outpatient EPO.  Transfusion as per cardiology.   5. Secondary Hyperparathyroidism: outpatient labs    LOS: 1 Delesa Kawa 8/19/202010:46 AM

## 2018-12-01 NOTE — Consult Note (Addendum)
PHARMACY CONSULT NOTE - FOLLOW UP  Pharmacy Consult for Electrolyte Monitoring and Replacement   Recent Labs: Potassium (mmol/L)  Date Value  12/01/2018 2.8 (L)   Magnesium (mg/dL)  Date Value  12/01/2018 1.2 (L)   Calcium (mg/dL)  Date Value  12/01/2018 7.8 (L)   Sodium (mmol/L)  Date Value  12/01/2018 133 (L)    Assessment: 73 yo male on peritoneal dialyses with hypokalemia  K=2.8 Mag = 1.2  Goal of Therapy:  Electrolytes wnl's  Plan:  Will give pt 3 x 74meq runs of IV Kcl and recheck potassium @1800  after complete.     (Dr Juleen China added 20 mEq po daily and 1 more IV run - original runs were re-timed per nursing request/IV status)  Will check magnesium as an add-on lab from am labs and replete if needed.  (add-on magnesium returned 1.2 - Will give 2g IV bolus magnesium sulfate and continue daily supplement)  Lu Duffel ,PharmD Clinical Pharmacist 12/01/2018 8:12 AM

## 2018-12-01 NOTE — Consult Note (Signed)
PHARMACY CONSULT NOTE - FOLLOW UP  Pharmacy Consult for Electrolyte Monitoring and Replacement   Recent Labs: Potassium (mmol/L)  Date Value  12/01/2018 3.4 (L)   Magnesium (mg/dL)  Date Value  12/01/2018 1.2 (L)   Calcium (mg/dL)  Date Value  12/01/2018 7.8 (L)   Sodium (mmol/L)  Date Value  12/01/2018 133 (L)    Assessment: 73 yo male on peritoneal dialyses with hypokalemia  K=3.4 Mag = 1.2  Goal of Therapy:  Electrolytes wnl's  Plan:  Will give pt 3 x 67meq runs of IV Kcl and recheck potassium @1800  after complete.     (Dr Juleen China added 20 mEq po daily and 1 more IV run - original runs were re-timed per nursing request/IV status)  Will check magnesium as an add-on lab from am labs and replete if needed.  (add-on magnesium returned 1.2 - Will give 2g IV bolus magnesium sulfate and continue daily supplement)   8/19 PM: K 3.4 KCl 10 mEq x 2 doses have not been given. I talked to [facility] and she said she will be giving the remaining bags of KCL IV. No additional replacement at this time. Will follow potassium with AM labs.    Rowland Lathe ,PharmD Clinical Pharmacist 12/01/2018 7:24 PM

## 2018-12-01 NOTE — Consult Note (Signed)
[place] for Heparin Indication: ACS / STEMI/UA  Allergies  Allergen Reactions  . Buchu-Cornsilk-Ch Grass-Hydran     Other reaction(s): Unknown  . Codeine Other (See Comments)    headaches  . Sulfabenzamide Other (See Comments)    High fever  . Thiazide-Type Diuretics Other (See Comments)    Patient Measurements: Height: 5\' 10"  (177.8 cm) Weight: 202 lb 4.8 oz (91.8 kg) IBW/kg (Calculated) : 73 Heparin Dosing Weight: 91.2 kg   Vital Signs: Temp: 98 F (36.7 C) (08/19 0429) Temp Source: Oral (08/19 0429) BP: 170/82 (08/19 0429) Pulse Rate: 101 (08/19 0429)  Labs: Recent Labs    11/30/18 1448 11/30/18 1748 12/01/18 0303  HGB 8.6*  --  8.1*  HCT 24.4*  --  23.6*  PLT 136*  --  132*  APTT 37*  --   --   LABPROT 14.8  --   --   INR 1.2  --   --   HEPARINUNFRC  --   --  0.33  CREATININE 7.43*  --  7.24*  TROPONINIHS 21* 20*  --     Estimated Creatinine Clearance: 10.7 mL/min (A) (by C-G formula based on SCr of 7.24 mg/dL (H)).   Medications:  No anticoagulants PTA- confirmed with patient.  Plavix and aspirin  Assessment: Todd Barr is a 73 y.o. male with a history of hypertension, end-stage renal disease on peritoneal dialysis, diabetes, GERD, CAD status post MI and 3 stents who reports intermittent left-sided chest pain that radiates through his body into his left leg.   Troponin 21 >> 20 Hgb 8.6 & Plt 136 >> 8.1 & 132  0819 @ 0303 HL 0.33, therapeutic x 1 on 1050 units/hr  Goal of Therapy:  Heparin level 0.3-0.7 units/ml Monitor platelets by anticoagulation protocol: Yes   Plan:  Heparin DW: 91.2 kg  Continue heparin infusion at 1050 units/hr Check anti-Xa level in 6 hours and daily while on heparin, per protocol Continue to monitor H&H and platelets   Todd Barr, Todd Barr A 12/01/2018,5:47 AM

## 2018-12-01 NOTE — Consult Note (Signed)
[place] for Heparin Indication: ACS / STEMI/UA  Allergies  Allergen Reactions  . Buchu-Cornsilk-Ch Grass-Hydran     Other reaction(s): Unknown  . Codeine Other (See Comments)    headaches  . Sulfabenzamide Other (See Comments)    High fever  . Thiazide-Type Diuretics Other (See Comments)    Patient Measurements: Height: 5\' 10"  (177.8 cm) Weight: 202 lb 4.8 oz (91.8 kg) IBW/kg (Calculated) : 73 Heparin Dosing Weight: 91.2 kg   Vital Signs: Temp: 98.2 F (36.8 C) (08/19 0758) Temp Source: Oral (08/19 0758) BP: 156/72 (08/19 0758) Pulse Rate: 94 (08/19 0758)  Labs: Recent Labs    11/30/18 1448 11/30/18 1748 12/01/18 0303 12/01/18 0937  HGB 8.6*  --  8.1*  --   HCT 24.4*  --  23.6*  --   PLT 136*  --  132*  --   APTT 37*  --   --   --   LABPROT 14.8  --   --   --   INR 1.2  --   --   --   HEPARINUNFRC  --   --  0.33 0.20*  CREATININE 7.43*  --  7.24*  --   TROPONINIHS 21* 20*  --   --     Estimated Creatinine Clearance: 10.7 mL/min (A) (by C-G formula based on SCr of 7.24 mg/dL (H)).   Medications:  No anticoagulants PTA- confirmed with patient.  Plavix and aspirin  Assessment: Todd Barr is a 73 y.o. male with a history of hypertension, end-stage renal disease on peritoneal dialysis, diabetes, GERD, CAD status post MI and 3 stents who reports intermittent left-sided chest pain that radiates through his body into his left leg.   Troponin 21 >> 20 Hgb 8.6 & Plt 136 >> 8.1 & 132  0819 @ 0303 HL 0.33, therapeutic x 1 on 1050 units/hr 0819 @ 0937 HL 0.20 - subtherapeutic (verified no interruptions with nursing)  Goal of Therapy:  Heparin level 0.3-0.7 units/ml Monitor platelets by anticoagulation protocol: Yes   Plan:  Heparin DW: 91.2 kg   Will bolus 1500 units, followed by a dose increase to 1200 units/hr  Continue heparin infusion at 1050 units/hr Check anti-Xa level in 8 hours and CBC daily while on  heparin, per protocol Continue to monitor H&H and platelets   Lu Duffel, PharmD, BCPS Clinical Pharmacist 12/01/2018 10:28 AM

## 2018-12-02 ENCOUNTER — Encounter: Payer: Self-pay | Admitting: Internal Medicine

## 2018-12-02 LAB — BASIC METABOLIC PANEL
Anion gap: 12 (ref 5–15)
BUN: 53 mg/dL — ABNORMAL HIGH (ref 8–23)
CO2: 22 mmol/L (ref 22–32)
Calcium: 8.4 mg/dL — ABNORMAL LOW (ref 8.9–10.3)
Chloride: 99 mmol/L (ref 98–111)
Creatinine, Ser: 7.24 mg/dL — ABNORMAL HIGH (ref 0.61–1.24)
GFR calc Af Amer: 8 mL/min — ABNORMAL LOW (ref >60)
GFR calc non Af Amer: 7 mL/min — ABNORMAL LOW (ref >60)
Glucose, Bld: 312 mg/dL — ABNORMAL HIGH (ref 70–99)
Potassium: 3.3 mmol/L — ABNORMAL LOW (ref 3.5–5.1)
Sodium: 133 mmol/L — ABNORMAL LOW (ref 135–145)

## 2018-12-02 LAB — CBC
HCT: 21.6 % — ABNORMAL LOW (ref 39.0–52.0)
Hemoglobin: 7.3 g/dL — ABNORMAL LOW (ref 13.0–17.0)
MCH: 31.2 pg (ref 26.0–34.0)
MCHC: 33.8 g/dL (ref 30.0–36.0)
MCV: 92.3 fL (ref 80.0–100.0)
Platelets: 125 10*3/uL — ABNORMAL LOW (ref 150–400)
RBC: 2.34 MIL/uL — ABNORMAL LOW (ref 4.22–5.81)
RDW: 13.3 % (ref 11.5–15.5)
WBC: 9.4 10*3/uL (ref 4.0–10.5)
nRBC: 0 % (ref 0.0–0.2)

## 2018-12-02 LAB — MAGNESIUM: Magnesium: 1.7 mg/dL (ref 1.7–2.4)

## 2018-12-02 LAB — T3, FREE: T3, Free: 2.8 pg/mL (ref 2.0–4.4)

## 2018-12-02 LAB — GLUCOSE, CAPILLARY: Glucose-Capillary: 331 mg/dL — ABNORMAL HIGH (ref 70–99)

## 2018-12-02 MED ORDER — MAGNESIUM OXIDE 400 (241.3 MG) MG PO TABS: 400.0000 mg | ORAL_TABLET | Freq: Once | ORAL | Status: DC

## 2018-12-02 MED ORDER — POTASSIUM CHLORIDE 20 MEQ PO PACK
20.0000 meq | PACK | Freq: Once | ORAL | Status: AC
  Administered 2018-12-02: 20 meq via ORAL
  Filled 2018-12-02: qty 1

## 2018-12-02 MED ORDER — INSULIN REGULAR HUMAN 100 UNIT/ML IJ SOLN: 8.0000 [IU] | Freq: Three times a day (TID) | INTRAMUSCULAR | 11 refills | Status: AC

## 2018-12-02 MED ORDER — INSULIN GLARGINE 100 UNIT/ML ~~LOC~~ SOLN
20.0000 [IU] | Freq: Every day | SUBCUTANEOUS | Status: DC
  Filled 2018-12-02 (×2): qty 0.2

## 2018-12-02 MED ORDER — HYDRALAZINE HCL 50 MG PO TABS: 50.0000 mg | ORAL_TABLET | Freq: Three times a day (TID) | ORAL | 0 refills | Status: AC

## 2018-12-02 MED ORDER — INSULIN GLARGINE 100 UNIT/ML ~~LOC~~ SOLN: 20.0000 [IU] | Freq: Two times a day (BID) | SUBCUTANEOUS | 11 refills | Status: AC

## 2018-12-02 NOTE — Consult Note (Addendum)
PHARMACY CONSULT NOTE - FOLLOW UP  Pharmacy Consult for Electrolyte Monitoring and Replacement   Recent Labs: Potassium (mmol/L)  Date Value  12/02/2018 3.3 (L)   Magnesium (mg/dL)  Date Value  12/02/2018 1.7   Calcium (mg/dL)  Date Value  12/02/2018 8.4 (L)   Sodium (mmol/L)  Date Value  12/02/2018 133 (L)    Assessment: 73 yo male on peritoneal dialyses with hypokalemia/hypomagnesemia  K=3.3 Mag = 1.7  Goal of Therapy:  Electrolytes wnl's  Plan:  Will give 63meq po KCl in addition to daily 20meq supplement for a total of 28meq.  Will continue daily oral Magnesium Oxide 400mg  - will give an additional 1 time afternoon dose as well.  Will re-check K and Mag with am labs.   Lu Duffel ,PharmD Clinical Pharmacist 12/02/2018 7:16 AM

## 2018-12-02 NOTE — Progress Notes (Signed)
Initial Nutrition Assessment  DOCUMENTATION CODES:   Not applicable  INTERVENTION:   Nepro Shake po BID, each supplement provides 425 kcal and 19 grams protein  Rena-vite daily   CHO controlled diet   NUTRITION DIAGNOSIS:   Increased nutrient needs related to chronic illness(ESRD on PD) as evidenced by increased estimated needs.  GOAL:   Patient will meet greater than or equal to 90% of their needs  MONITOR:   PO intake, Supplement acceptance, Labs, Weight trends, I & O's, Skin  REASON FOR ASSESSMENT:   Malnutrition Screening Tool    ASSESSMENT:   73 y.o. white male with end stage renal disease on peritoneal dialysis, hypertension, coronary artery disease status post stents, hyperlipidemia,  Diabetes mellitus type II, who presents to [facility] on 11/30/2018 for Unstable angina pectoris  RD working remotely.  Pt with good appetite and oral intake pta. Pt currently eating 85% of meals in hospital. RD will add supplements and vitamins to help pt meet his estimated needs and replace losses from PD. Will change pt to CHO controlled diet as pt with hyperglycemia. Per chart, pt appears weight stable pta.   Medications reviewed and include: aspirin, calcitriol, plavix, pepcid, ferrous sulfate, insulkin, Mg oxide, protonix, KCl  Labs reviewed: Na 133(L), K 3.3(L), BUN 53(H), creat 7.24(H), Mg 1.7 wnl Hgb 7.3(L), Hct 21.6(L) cbgs- 164, 153, 242, 249, 331 x 24 hrs AIC 8.4(h)- 8/19  Unable to complete Nutrition-Focused physical exam at this time.   Diet Order:   Diet Order            Diet Heart Room service appropriate? Yes; Fluid consistency: Thin  Diet effective now             EDUCATION NEEDS:   Education needs have been addressed  Skin:  Skin Assessment: Reviewed RN Assessment  Last BM:  8/20- type 5  Height:   Ht Readings from Last 1 Encounters:  12/01/18 5\' 10"  (1.778 m)    Weight:   Wt Readings from Last 1 Encounters:  12/02/18 94.6 kg    Ideal Body  Weight:  75.4 kg  BMI:  Body mass index is 29.92 kg/m.  Estimated Nutritional Needs:   Kcal:  2100-2400kcal/day  Protein:  100-120g/day  Fluid:  UOP +1L  Koleen Distance MS, RD, LDN Pager #- [phone] Office#- [phone] After Hours Pager: [phone]

## 2018-12-02 NOTE — Discharge Summary (Signed)
[place] [facility] [place] NAME: Todd Barr    MR#:  [id number]  DATE OF BIRTH:  29-Jan-1946  DATE OF ADMISSION:  11/30/2018 ADMITTING PHYSICIAN: Dustin Flock, MD  DATE OF DISCHARGE: 12/02/2018  PRIMARY CARE PHYSICIAN: System, Pcp Not In    ADMISSION DIAGNOSIS:  Unstable angina pectoris ([place]) [I20.0]  DISCHARGE DIAGNOSIS:  Active Problems:   NSTEMI ruled out-     Angina.   SECONDARY DIAGNOSIS:   Past Medical History:  Diagnosis Date  . CAD (coronary artery disease)   . Diabetes mellitus without complication ([place])   . DM (diabetes mellitus) ([place])   . ESRD (end stage renal disease) ([place])   . Hyperlipemia   . Hypertension   . Myocardial infarction ([place])   . Renal insufficiency     HOSPITAL COURSE:   73 year old male with end-stage renal disease on peritoneal dialysis and diabetes who presented to the emergency room due to chest pain.   1. Unstable angina: NSTEMI suspected but ruled out. Cardiac cath done, Stent patent, No major blockages, suggest to cont medical management. Continue heparin gtt, ASA, plavix, Metoprolol,atorvastatin  2.  Essential hypertension: Continue Norvasc, hydralazine, isosorbide and metoprolol.  3.  Diabetes: Continue sliding scale  4. HLD: Continue Lipitor  5. ESRD on PD, cont. DISCHARGE CONDITIONS:   Stable.  CONSULTS OBTAINED:   Cardiology and Nephrology.  DRUG ALLERGIES:   Allergies  Allergen Reactions  . Codeine Other (See Comments)    headaches  . Sulfabenzamide Other (See Comments)    High fever  . Thiazide-Type Diuretics Other (See Comments)    DISCHARGE MEDICATIONS:   Allergies as of 12/02/2018      Reactions   Codeine Other (See Comments)   headaches   Sulfabenzamide Other (See Comments)   High fever   Thiazide-type Diuretics Other (See Comments)      Medication List    STOP taking these medications   losartan 100 MG tablet Commonly known as: COZAAR      TAKE these medications   amLODipine 10 MG tablet Commonly known as: NORVASC Take 10 mg by mouth daily.   aspirin 81 MG chewable tablet Chew 81 mg by mouth daily.   atorvastatin 80 MG tablet Commonly known as: LIPITOR Take 80 mg by mouth at bedtime.   calcitRIOL 0.25 MCG capsule Commonly known as: ROCALTROL Take 0.25 mcg by mouth daily. Monday, Wednesday and Friday   calcium acetate 667 MG capsule Commonly known as: PHOSLO Take 1,334 mg by mouth 2 (two) times daily with a meal.   Cholecalciferol 25 MCG (1000 UT) tablet Take 1,000 Units by mouth daily.   clopidogrel 75 MG tablet Commonly known as: PLAVIX Take 75 mg by mouth daily.   ferrous sulfate 325 (65 FE) MG EC tablet Take 325 mg by mouth 3 (three) times daily with meals.   furosemide 80 MG tablet Commonly known as: LASIX Take 120 mg by mouth daily.   glucose blood test strip 1 each by Other route as needed for other. Use as instructed   hydrALAZINE 50 MG tablet Commonly known as: APRESOLINE Take 1 tablet (50 mg total) by mouth 3 (three) times daily. What changed:   how much to take  when to take this   insulin glargine 100 UNIT/ML injection Commonly known as: LANTUS Inject 0.2 mLs (20 Units total) into the skin 2 (two) times daily. What changed: how much to take   insulin regular 100 units/mL injection Commonly known as:  NOVOLIN R Inject 0.08 mLs (8 Units total) into the skin 3 (three) times daily before meals. What changed: how much to take   isosorbide mononitrate 60 MG 24 hr tablet Commonly known as: IMDUR Take 60 mg by mouth every morning.   magnesium oxide 400 MG tablet Commonly known as: MAG-OX Take 400 mg by mouth daily.   metoprolol 200 MG 24 hr tablet Commonly known as: TOPROL-XL Take 100 mg by mouth daily.   multivitamin Tabs tablet Take 1 tablet by mouth daily.   nitroGLYCERIN 0.4 MG SL tablet Commonly known as: NITROSTAT Place 0.4 mg under the tongue every 5 (five) minutes  as needed for chest pain.   omeprazole 20 MG capsule Commonly known as: PRILOSEC Take 20 mg by mouth every morning.   primidone 50 MG tablet Commonly known as: MYSOLINE Take 50 mg by mouth at bedtime.   ranitidine 150 MG tablet Commonly known as: ZANTAC Take 150 mg by mouth 2 (two) times daily as needed for heartburn.   simethicone 80 MG chewable tablet Commonly known as: MYLICON Chew 80 mg by mouth 3 (three) times daily as needed for flatulence.   sodium bicarbonate 650 MG tablet Take 650 mg by mouth 2 (two) times daily.        DISCHARGE INSTRUCTIONS:    Follow with cardio clinic in 2 weeks.  If you experience worsening of your admission symptoms, develop shortness of breath, life threatening emergency, suicidal or homicidal thoughts you must seek medical attention immediately by calling 911 or calling your MD immediately  if symptoms less severe.  You Must read complete instructions/literature along with all the possible adverse reactions/side effects for all the Medicines you take and that have been prescribed to you. Take any new Medicines after you have completely understood and accept all the possible adverse reactions/side effects.   Please note  You were cared for by a hospitalist during your hospital stay. If you have any questions about your discharge medications or the care you received while you were in the hospital after you are discharged, you can call the unit and asked to speak with the hospitalist on call if the hospitalist that took care of you is not available. Once you are discharged, your primary care physician will handle any further medical issues. Please note that NO REFILLS for any discharge medications will be authorized once you are discharged, as it is imperative that you return to your primary care physician (or establish a relationship with a primary care physician if you do not have one) for your aftercare needs so that they can reassess your need  for medications and monitor your lab values.    Today   CHIEF COMPLAINT:   Chief Complaint  Patient presents with  . Chest Pain    HISTORY OF PRESENT ILLNESS:  Todd Barr  is a 73 y.o. male with a known history of coronary artery disease, diabetes type 2, end-stage renal disease, hyperlipidemia who is presenting to the hospital with chest pain. Patient states that his symptoms started for 1 week and has progressively gotten worse.  He also states the pain goes down to his leg.  There is no nausea or vomiting.  No radiation of the pain.  He states that is similar to previously when he had required his stents.  VITAL SIGNS:  Blood pressure (!) 171/73, pulse (!) 106, temperature 98.1 F (36.7 C), temperature source Oral, resp. rate 19, height 5\' 10"  (1.778 m), weight 94.6 kg, SpO2 97 %.  I/O:    Intake/Output Summary (Last 24 hours) at 12/02/2018 1515 Last data filed at 12/02/2018 1402 Gross per 24 hour  Intake 960 ml  Output 1 ml  Net 959 ml    PHYSICAL EXAMINATION:  GENERAL:  73 y.o.-year-old patient lying in the bed with no acute distress.  EYES: Pupils equal, round, reactive to light and accommodation. No scleral icterus. Extraocular muscles intact.  HEENT: Head atraumatic, normocephalic. Oropharynx and nasopharynx clear.  NECK:  Supple, no jugular venous distention. No thyroid enlargement, no tenderness.  LUNGS: Normal breath sounds bilaterally, no wheezing, rales,rhonchi or crepitation. No use of accessory muscles of respiration.  CARDIOVASCULAR: S1, S2 normal. No murmurs, rubs, or gallops.  ABDOMEN: Soft, non-tender, non-distended. Bowel sounds present. No organomegaly or mass.  EXTREMITIES: No pedal edema, cyanosis, or clubbing.  NEUROLOGIC: Cranial nerves II through XII are intact. Muscle strength 5/5 in all extremities. Sensation intact. Gait not checked.  PSYCHIATRIC: The patient is alert and oriented x 3.  SKIN: No obvious rash, lesion, or ulcer.   DATA REVIEW:    CBC Recent Labs  Lab 12/02/18 0448  WBC 9.4  HGB 7.3*  HCT 21.6*  PLT 125*    Chemistries  Recent Labs  Lab 12/02/18 0448  NA 133*  K 3.3*  CL 99  CO2 22  GLUCOSE 312*  BUN 53*  CREATININE 7.24*  CALCIUM 8.4*  MG 1.7    Cardiac Enzymes No results for input(s): TROPONINI in the last 168 hours.  Microbiology Results  Results for orders placed or performed during the hospital encounter of 11/30/18  SARS Coronavirus 2 [facility] order, Performed in [facility] hospital lab) Nasopharyngeal Nasopharyngeal Swab     Status: None   Collection Time: 11/30/18  6:47 PM   Specimen: Nasopharyngeal Swab  Result Value Ref Range Status   SARS Coronavirus 2 NEGATIVE NEGATIVE Final    Comment: (NOTE) If result is NEGATIVE SARS-CoV-2 target nucleic acids are NOT DETECTED. The SARS-CoV-2 RNA is generally detectable in upper and lower  respiratory specimens during the acute phase of infection. The lowest  concentration of SARS-CoV-2 viral copies this assay can detect is 250  copies / mL. A negative result does not preclude SARS-CoV-2 infection  and should not be used as the sole basis for treatment or other  patient management decisions.  A negative result may occur with  improper specimen collection / handling, submission of specimen other  than nasopharyngeal swab, presence of viral mutation(s) within the  areas targeted by this assay, and inadequate number of viral copies  (<250 copies / mL). A negative result must be combined with clinical  observations, patient history, and epidemiological information. If result is POSITIVE SARS-CoV-2 target nucleic acids are DETECTED. The SARS-CoV-2 RNA is generally detectable in upper and lower  respiratory specimens dur ing the acute phase of infection.  Positive  results are indicative of active infection with SARS-CoV-2.  Clinical  correlation with patient history and other diagnostic information is  necessary to determine patient  infection status.  Positive results do  not rule out bacterial infection or co-infection with other viruses. If result is PRESUMPTIVE POSTIVE SARS-CoV-2 nucleic acids MAY BE PRESENT.   A presumptive positive result was obtained on the submitted specimen  and confirmed on repeat testing.  While 2019 novel coronavirus  (SARS-CoV-2) nucleic acids may be present in the submitted sample  additional confirmatory testing may be necessary for epidemiological  and / or clinical management purposes  to differentiate between  SARS-CoV-2 and other Sarbecovirus currently known to infect humans.  If clinically indicated additional testing with an alternate test  methodology [phone]) is advised. The SARS-CoV-2 RNA is generally  detectable in upper and lower respiratory sp ecimens during the acute  phase of infection. The expected result is Negative. Fact Sheet for Patients:  StrictlyIdeas.no Fact Sheet for Healthcare Providers: BankingDealers.co.za This test is not yet approved or cleared by the [place] FDA and has been authorized for detection and/or diagnosis of SARS-CoV-2 by FDA under an Emergency Use Authorization (EUA).  This EUA will remain in effect (meaning this test can be used) for the duration of the COVID-19 declaration under Section 564(b)(1) of the Act, 21 U.S.C. section 360bbb-3(b)(1), unless the authorization is terminated or revoked sooner. Performed at [facility], [address] [place], [place] [address]     RADIOLOGY:  Dg Chest 2 View  Result Date: 11/30/2018 CLINICAL DATA:  Chest pain EXAM: CHEST - 2 VIEW COMPARISON:  None. FINDINGS: Cardiac shadows within normal limits. The lungs are clear bilaterally. Scattered calcified granulomas are seen. Degenerative changes of the thoracic spine are noted. IMPRESSION: No active cardiopulmonary disease. Electronically Signed   By: Inez Catalina M.D.   On: 11/30/2018 15:30    US Venous Img Lower Unilateral Left  Result Date: 11/30/2018 CLINICAL DATA:  73 year old male with heaviness and swelling of the left leg. EXAM: LEFT LOWER EXTREMITY VENOUS DOPPLER ULTRASOUND TECHNIQUE: Gray-scale sonography with graded compression, as well as color Doppler and duplex ultrasound were performed to evaluate the lower extremity deep venous systems from the level of the common femoral vein and including the common femoral, femoral, profunda femoral, popliteal and calf veins including the posterior tibial, peroneal and gastrocnemius veins when visible. The superficial great saphenous vein was also interrogated. Spectral Doppler was utilized to evaluate flow at rest and with distal augmentation maneuvers in the common femoral, femoral and popliteal veins. COMPARISON:  None. FINDINGS: Contralateral Common Femoral Vein: Respiratory phasicity is normal and symmetric with the symptomatic side. No evidence of thrombus. Normal compressibility. Common Femoral Vein: No evidence of thrombus. Normal compressibility, respiratory phasicity and response to augmentation. Saphenofemoral Junction: No evidence of thrombus. Normal compressibility and flow on color Doppler imaging. Profunda Femoral Vein: No evidence of thrombus. Normal compressibility and flow on color Doppler imaging. Femoral Vein: No evidence of thrombus. Normal compressibility, respiratory phasicity and response to augmentation. Popliteal Vein: No evidence of thrombus. Normal compressibility, respiratory phasicity and response to augmentation. Calf Veins: No evidence of thrombus. Normal compressibility and flow on color Doppler imaging. Superficial Great Saphenous Vein: No evidence of thrombus. Normal compressibility. Venous Reflux:  None. Other Findings:  None. IMPRESSION: No evidence of deep venous thrombosis. Electronically Signed   By: Jacqulynn Cadet M.D.   On: 11/30/2018 16:02   Ct Angio Chest/abd/pel For Dissection W And/or Wo  Contrast  Result Date: 11/30/2018 CLINICAL DATA:  Aortic disease.  Intermittent left-sided chest pain EXAM: CT ANGIOGRAPHY CHEST, ABDOMEN AND PELVIS TECHNIQUE: Multidetector CT imaging through the chest, abdomen and pelvis was performed using the standard protocol during bolus administration of intravenous contrast. Multiplanar reconstructed images and MIPs were obtained and reviewed to evaluate the vascular anatomy. CONTRAST:  130mL ISOVUE-370 IOPAMIDOL (ISOVUE-370) INJECTION 76% COMPARISON:  None. FINDINGS: CTA CHEST FINDINGS Cardiovascular: Contrast injection is sufficient to demonstrate satisfactory opacification of the pulmonary arteries to the segmental level. There is no pulmonary embolus. The main pulmonary artery is within normal limits for size. There is no CT evidence  of acute right heart strain. There is no evidence for a dissection. Aortic calcifications are noted. Coronary artery calcifications are noted. There is likely coronary artery stent. Heart size is normal. There is no significant pericardial effusion. Mediastinum/Nodes: --No mediastinal or hilar lymphadenopathy. --No axillary lymphadenopathy. --No supraclavicular lymphadenopathy. --Normal thyroid gland. --The esophagus is unremarkable Lungs/Pleura: There is a 4 mm pulmonary nodule in the right lower lobe (axial series 7, image 112). There is an additional 3 mm pulmonary nodule in the right lower lobe (axial series 7, image 126). There is a 4 mm pulmonary nodule in the left lower lobe (axial series 7, image 64). The trachea is grossly unremarkable. There is no pneumothorax. No significant pleural effusion. Musculoskeletal: There is a posterior sebaceous cyst measuring approximately 2.8 cm. There is no displaced fracture. Review of the MIP images confirms the above findings. CTA ABDOMEN AND PELVIS FINDINGS VASCULAR Aorta: Normal caliber aorta without aneurysm, dissection, vasculitis or significant stenosis. Celiac: Patent without evidence of  aneurysm, dissection, vasculitis or significant stenosis. SMA: There is some mild to moderate narrowing of the proximal SMA. The distal SMA is grossly unremarkable. Renals: There is mild-to-moderate narrowing of the proximal left renal artery. There is minimal narrowing at the proximal right renal artery. There are 2 right renal arteries. The more inferior renal artery also demonstrates some significant narrowing. IMA: Patent without evidence of aneurysm, dissection, vasculitis or significant stenosis. Inflow: Patent without evidence of aneurysm, dissection, vasculitis or significant stenosis. There is mild narrowing at the origin of the left SFA. Veins: No obvious venous abnormality within the limitations of this arterial phase study. Review of the MIP images confirms the above findings. NON-VASCULAR Hepatobiliary: The liver demonstrates a somewhat nodular contour. There is mild gallbladder wall thickening which is likely secondary to the presence of free fluid.There is no biliary ductal dilation. Pancreas: Normal contours without ductal dilatation. No peripancreatic fluid collection. Spleen: No splenic laceration or hematoma. Adrenals/Urinary Tract: --Adrenal glands: No adrenal hemorrhage. 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 --Left kidney/ureter: No hydronephrosis or perinephric hematoma. --Urinary bladder: Unremarkable. Stomach/Bowel: --Stomach/Duodenum: No hiatal hernia or other gastric abnormality. Normal duodenal course and caliber. --Small bowel: No dilatation or inflammation. --Colon: No focal abnormality. --Appendix: Normal. Lymphatic: --No retroperitoneal lymphadenopathy. --No mesenteric lymphadenopathy. --No pelvic or inguinal lymphadenopathy. Reproductive: Unremarkable Other: There is a moderate amount of free fluid in the patient's abdomen. There is a catheter coursing through the anterior right abdominal wall and terminating in the left mid abdomen. This is favored to  represent the patient's peritoneal dialysis catheter. There is no free air. There are a few areas of fat stranding in the anterior abdominal wall, left greater than right. This may represent sites of subcutaneous injections. There is no significant abdominal wall hernia. Musculoskeletal. No acute displaced fractures. Review of the MIP images confirms the above findings. IMPRESSION: 1. No acute vascular abnormality detected. Specifically, there is no dissection or pulmonary embolism. 2. Free fluid in the patient's abdomen presumably secondary to the patient's peritoneal dialysis catheter. However, there is a somewhat nodular appearance of the liver which raises suspicion for underlying cirrhosis. 3. Mild wall thickening of the gallbladder which is favored to be secondary to the presence of ascites. If there is clinical concern for acute cholecystitis follow-up with ultrasound is recommended. 4. Multiple scattered pulmonary nodules measuring up to approximately 4 mm as detailed above. No follow-up needed if patient is low-risk (and has no known or suspected primary neoplasm). Non-contrast chest CT  can be considered in 12 months if patient is high-risk. This recommendation follows the consensus statement: Guidelines for Management of Incidental Pulmonary Nodules Detected on CT Images: From the Fleischner Society 2017; Radiology 2017; 284:228-243. 5. Atrophic right kidney.  No hydronephrosis. Electronically Signed   By: Constance Holster M.D.   On: 11/30/2018 17:33    EKG:   Orders placed or performed during the hospital encounter of 11/30/18  . EKG 12-Lead  . EKG 12-Lead  . ED EKG  . ED EKG  . EKG 12-lead  . EKG 12-Lead  . EKG 12-Lead  . EKG 12-Lead  . EKG      Management plans discussed with the patient, family and they are in agreement.  CODE STATUS:     Code Status Orders  (From admission, onward)         Start     Ordered   12/01/18 0133  Full code  Continuous     12/01/18 0132         Code Status History    This patient has a current code status but no historical code status.   Advance Care Planning Activity    Advance Directive Documentation     Most Recent Value  Type of Advance Directive  Healthcare Power of Attorney  Pre-existing out of facility DNR order (yellow form or pink MOST form)  -  "MOST" Form in Place?  -      TOTAL TIME TAKING CARE OF THIS PATIENT: 35 minutes.    Vaughan Basta M.D on 12/02/2018 at 3:15 PM  Between 7am to 6pm - Pager - [phone]  After 6pm go to [email or web address] - password [email or web address] [facility]  Office  [phone]  CC: Primary care physician; System, Pcp Not In   Note: This dictation was prepared with Dragon dictation along with smaller phrase technology. Any transcriptional errors that result from this process are unintentional.

## 2018-12-02 NOTE — Progress Notes (Signed)
Discharged to home with wife.  Prescriptions printed for him.  Instructions regarding femoral site care.

## 2018-12-02 NOTE — Progress Notes (Signed)
[facility]  ROUNDING NOTE   Subjective:   Cardiac catheterization yesterday.  No intervention.   Peritoneal dialysis last night,  2.5% dextrose. Tolerated treatment well.   Objective:  Vital signs in last 24 hours:  Temp:  [97.9 F (36.6 C)-98.3 F (36.8 C)] 98.1 F (36.7 C) (08/20 0749) Pulse Rate:  [95-122] 106 (08/20 0749) Resp:  [13-20] 19 (08/20 0749) BP: (152-171)/(66-81) 171/73 (08/20 0749) SpO2:  [92 %-98 %] 97 % (08/20 0749) Weight:  [91.8 kg-94.6 kg] 94.6 kg (08/20 0329)  Weight change: 0.627 kg Filed Weights   12/01/18 0429 12/01/18 1211 12/02/18 0329  Weight: 91.8 kg 91.8 kg 94.6 kg    Intake/Output: I/O last 3 completed shifts: In: -  Out: 1 [Stool:1]   Intake/Output this shift:  No intake/output data recorded.  Physical Exam: General: NAD, laying in bed  Head: Normocephalic, atraumatic. Moist oral mucosal membranes  Eyes: Anicteric, PERRL  Neck: Supple, trachea midline  Lungs:  Clear to auscultation  Heart: Regular rate and rhythm  Abdomen:  Soft, nontender,   Extremities:  no peripheral edema.  Neurologic: Nonfocal, moving all four extremities  Skin: No lesions  Access: Left AVF, PD catheter    Basic Metabolic Panel: Recent Labs  Lab 11/30/18 1448 12/01/18 0303 12/01/18 0937 12/01/18 1802 12/02/18 0448  NA 135 133*  --   --  133*  K 3.0* 2.8*  --  3.4* 3.3*  CL 98 99  --   --  99  CO2 24 22  --   --  22  GLUCOSE 169* 262* 190*  --  312*  BUN 62* 56*  --   --  53*  CREATININE 7.43* 7.24*  --   --  7.24*  CALCIUM 8.1* 7.8*  --   --  8.4*  MG  --   --  1.2*  --  1.7    Liver Function Tests: No results for input(s): AST, ALT, ALKPHOS, BILITOT, PROT, ALBUMIN in the last 168 hours. No results for input(s): LIPASE, AMYLASE in the last 168 hours. No results for input(s): AMMONIA in the last 168 hours.  CBC: Recent Labs  Lab 11/30/18 1448 12/01/18 0303 12/02/18 0448  WBC 8.3 9.4 9.4  HGB 8.6* 8.1* 7.3*  HCT 24.4*  23.6* 21.6*  MCV 91.4 91.5 92.3  PLT 136* 132* 125*    Cardiac Enzymes: No results for input(s): CKTOTAL, CKMB, CKMBINDEX, TROPONINI in the last 168 hours.  BNP: Invalid input(s): POCBNP  CBG: Recent Labs  Lab 12/01/18 1137 12/01/18 1211 12/01/18 1712 12/01/18 1747 12/01/18 2246  GLUCAP 171* 164* 153* 242* 249*    Microbiology: Results for orders placed or performed during the hospital encounter of 11/30/18  SARS Coronavirus 2 [facility] order, Performed in [facility] hospital lab) Nasopharyngeal Nasopharyngeal Swab     Status: None   Collection Time: 11/30/18  6:47 PM   Specimen: Nasopharyngeal Swab  Result Value Ref Range Status   SARS Coronavirus 2 NEGATIVE NEGATIVE Final    Comment: (NOTE) If result is NEGATIVE SARS-CoV-2 target nucleic acids are NOT DETECTED. The SARS-CoV-2 RNA is generally detectable in upper and lower  respiratory specimens during the acute phase of infection. The lowest  concentration of SARS-CoV-2 viral copies this assay can detect is 250  copies / mL. A negative result does not preclude SARS-CoV-2 infection  and should not be used as the sole basis for treatment or other  patient management decisions.  A negative result may occur with  improper  specimen collection / handling, submission of specimen other  than nasopharyngeal swab, presence of viral mutation(s) within the  areas targeted by this assay, and inadequate number of viral copies  (<250 copies / mL). A negative result must be combined with clinical  observations, patient history, and epidemiological information. If result is POSITIVE SARS-CoV-2 target nucleic acids are DETECTED. The SARS-CoV-2 RNA is generally detectable in upper and lower  respiratory specimens dur ing the acute phase of infection.  Positive  results are indicative of active infection with SARS-CoV-2.  Clinical  correlation with patient history and other diagnostic information is  necessary to determine patient  infection status.  Positive results do  not rule out bacterial infection or co-infection with other viruses. If result is PRESUMPTIVE POSTIVE SARS-CoV-2 nucleic acids MAY BE PRESENT.   A presumptive positive result was obtained on the submitted specimen  and confirmed on repeat testing.  While 2019 novel coronavirus  (SARS-CoV-2) nucleic acids may be present in the submitted sample  additional confirmatory testing may be necessary for epidemiological  and / or clinical management purposes  to differentiate between  SARS-CoV-2 and other Sarbecovirus currently known to infect humans.  If clinically indicated additional testing with an alternate test  methodology [phone]) is advised. The SARS-CoV-2 RNA is generally  detectable in upper and lower respiratory sp ecimens during the acute  phase of infection. The expected result is Negative. Fact Sheet for Patients:  StrictlyIdeas.no Fact Sheet for Healthcare Providers: BankingDealers.co.za This test is not yet approved or cleared by the [place] FDA and has been authorized for detection and/or diagnosis of SARS-CoV-2 by FDA under an Emergency Use Authorization (EUA).  This EUA will remain in effect (meaning this test can be used) for the duration of the COVID-19 declaration under Section 564(b)(1) of the Act, 21 U.S.C. section 360bbb-3(b)(1), unless the authorization is terminated or revoked sooner. Performed at [facility], [address] [place], [place] [address]     Coagulation Studies: Recent Labs    11/30/18 1448  LABPROT 14.8  INR 1.2    Urinalysis: No results for input(s): COLORURINE, LABSPEC, PHURINE, GLUCOSEU, HGBUR, BILIRUBINUR, KETONESUR, PROTEINUR, UROBILINOGEN, NITRITE, LEUKOCYTESUR in the last 72 hours.  Invalid input(s): APPERANCEUR    Imaging: Dg Chest 2 View  Result Date: 11/30/2018 CLINICAL DATA:  Chest pain EXAM: CHEST - 2 VIEW COMPARISON:   None. FINDINGS: Cardiac shadows within normal limits. The lungs are clear bilaterally. Scattered calcified granulomas are seen. Degenerative changes of the thoracic spine are noted. IMPRESSION: No active cardiopulmonary disease. Electronically Signed   By: Inez Catalina M.D.   On: 11/30/2018 15:30   US Venous Img Lower Unilateral Left  Result Date: 11/30/2018 CLINICAL DATA:  73 year old male with heaviness and swelling of the left leg. EXAM: LEFT LOWER EXTREMITY VENOUS DOPPLER ULTRASOUND TECHNIQUE: Gray-scale sonography with graded compression, as well as color Doppler and duplex ultrasound were performed to evaluate the lower extremity deep venous systems from the level of the common femoral vein and including the common femoral, femoral, profunda femoral, popliteal and calf veins including the posterior tibial, peroneal and gastrocnemius veins when visible. The superficial great saphenous vein was also interrogated. Spectral Doppler was utilized to evaluate flow at rest and with distal augmentation maneuvers in the common femoral, femoral and popliteal veins. COMPARISON:  None. FINDINGS: Contralateral Common Femoral Vein: Respiratory phasicity is normal and symmetric with the symptomatic side. No evidence of thrombus. Normal compressibility. Common Femoral Vein: No evidence of thrombus. Normal compressibility,  respiratory phasicity and response to augmentation. Saphenofemoral Junction: No evidence of thrombus. Normal compressibility and flow on color Doppler imaging. Profunda Femoral Vein: No evidence of thrombus. Normal compressibility and flow on color Doppler imaging. Femoral Vein: No evidence of thrombus. Normal compressibility, respiratory phasicity and response to augmentation. Popliteal Vein: No evidence of thrombus. Normal compressibility, respiratory phasicity and response to augmentation. Calf Veins: No evidence of thrombus. Normal compressibility and flow on color Doppler imaging. Superficial Great  Saphenous Vein: No evidence of thrombus. Normal compressibility. Venous Reflux:  None. Other Findings:  None. IMPRESSION: No evidence of deep venous thrombosis. Electronically Signed   By: Jacqulynn Cadet M.D.   On: 11/30/2018 16:02   Ct Angio Chest/abd/pel For Dissection W And/or Wo Contrast  Result Date: 11/30/2018 CLINICAL DATA:  Aortic disease.  Intermittent left-sided chest pain EXAM: CT ANGIOGRAPHY CHEST, ABDOMEN AND PELVIS TECHNIQUE: Multidetector CT imaging through the chest, abdomen and pelvis was performed using the standard protocol during bolus administration of intravenous contrast. Multiplanar reconstructed images and MIPs were obtained and reviewed to evaluate the vascular anatomy. CONTRAST:  128mL ISOVUE-370 IOPAMIDOL (ISOVUE-370) INJECTION 76% COMPARISON:  None. FINDINGS: CTA CHEST FINDINGS Cardiovascular: Contrast injection is sufficient to demonstrate satisfactory opacification of the pulmonary arteries to the segmental level. There is no pulmonary embolus. The main pulmonary artery is within normal limits for size. There is no CT evidence of acute right heart strain. There is no evidence for a dissection. Aortic calcifications are noted. Coronary artery calcifications are noted. There is likely coronary artery stent. Heart size is normal. There is no significant pericardial effusion. Mediastinum/Nodes: --No mediastinal or hilar lymphadenopathy. --No axillary lymphadenopathy. --No supraclavicular lymphadenopathy. --Normal thyroid gland. --The esophagus is unremarkable Lungs/Pleura: There is a 4 mm pulmonary nodule in the right lower lobe (axial series 7, image 112). There is an additional 3 mm pulmonary nodule in the right lower lobe (axial series 7, image 126). There is a 4 mm pulmonary nodule in the left lower lobe (axial series 7, image 64). The trachea is grossly unremarkable. There is no pneumothorax. No significant pleural effusion. Musculoskeletal: There is a posterior sebaceous cyst  measuring approximately 2.8 cm. There is no displaced fracture. Review of the MIP images confirms the above findings. CTA ABDOMEN AND PELVIS FINDINGS VASCULAR Aorta: Normal caliber aorta without aneurysm, dissection, vasculitis or significant stenosis. Celiac: Patent without evidence of aneurysm, dissection, vasculitis or significant stenosis. SMA: There is some mild to moderate narrowing of the proximal SMA. The distal SMA is grossly unremarkable. Renals: There is mild-to-moderate narrowing of the proximal left renal artery. There is minimal narrowing at the proximal right renal artery. There are 2 right renal arteries. The more inferior renal artery also demonstrates some significant narrowing. IMA: Patent without evidence of aneurysm, dissection, vasculitis or significant stenosis. Inflow: Patent without evidence of aneurysm, dissection, vasculitis or significant stenosis. There is mild narrowing at the origin of the left SFA. Veins: No obvious venous abnormality within the limitations of this arterial phase study. Review of the MIP images confirms the above findings. NON-VASCULAR Hepatobiliary: The liver demonstrates a somewhat nodular contour. There is mild gallbladder wall thickening which is likely secondary to the presence of free fluid.There is no biliary ductal dilation. Pancreas: Normal contours without ductal dilatation. No peripancreatic fluid collection. Spleen: No splenic laceration or hematoma. Adrenals/Urinary Tract: --Adrenal glands: No adrenal hemorrhage. 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 --Left kidney/ureter: No hydronephrosis or perinephric hematoma. --Urinary bladder: Unremarkable. Stomach/Bowel: --Stomach/Duodenum: No hiatal  hernia or other gastric abnormality. Normal duodenal course and caliber. --Small bowel: No dilatation or inflammation. --Colon: No focal abnormality. --Appendix: Normal. Lymphatic: --No retroperitoneal lymphadenopathy. --No mesenteric  lymphadenopathy. --No pelvic or inguinal lymphadenopathy. Reproductive: Unremarkable Other: There is a moderate amount of free fluid in the patient's abdomen. There is a catheter coursing through the anterior right abdominal wall and terminating in the left mid abdomen. This is favored to represent the patient's peritoneal dialysis catheter. There is no free air. There are a few areas of fat stranding in the anterior abdominal wall, left greater than right. This may represent sites of subcutaneous injections. There is no significant abdominal wall hernia. Musculoskeletal. No acute displaced fractures. Review of the MIP images confirms the above findings. IMPRESSION: 1. No acute vascular abnormality detected. Specifically, there is no dissection or pulmonary embolism. 2. Free fluid in the patient's abdomen presumably secondary to the patient's peritoneal dialysis catheter. However, there is a somewhat nodular appearance of the liver which raises suspicion for underlying cirrhosis. 3. Mild wall thickening of the gallbladder which is favored to be secondary to the presence of ascites. If there is clinical concern for acute cholecystitis follow-up with ultrasound is recommended. 4. Multiple scattered pulmonary nodules measuring up to approximately 4 mm as detailed above. No follow-up needed if patient is low-risk (and has no known or suspected primary neoplasm). Non-contrast chest CT can be considered in 12 months if patient is high-risk. This recommendation follows the consensus statement: Guidelines for Management of Incidental Pulmonary Nodules Detected on CT Images: From the Fleischner Society 2017; Radiology 2017; 284:228-243. 5. Atrophic right kidney.  No hydronephrosis. Electronically Signed   By: Constance Holster M.D.   On: 11/30/2018 17:33     Medications:   . sodium chloride    . dialysis solution 2.5% low-MG/low-CA     . amLODipine  10 mg Oral Daily  . aspirin  81 mg Oral Daily  . atorvastatin   80 mg Oral QHS  . calcitRIOL  0.25 mcg Oral Q M,W,F  . calcium acetate  1,334 mg Oral BID WC  . clopidogrel  75 mg Oral Daily  . famotidine  20 mg Oral Daily  . feeding supplement (NEPRO CARB STEADY)  237 mL Oral BID BM  . ferrous sulfate  325 mg Oral TID WC  . gentamicin cream  1 application Topical Daily  . hydrALAZINE  50 mg Oral TID  . insulin aspart  0-5 Units Subcutaneous QHS  . insulin aspart  0-9 Units Subcutaneous TID WC  . isosorbide mononitrate  60 mg Oral BH-q7a  . magnesium oxide  400 mg Oral Daily  . metoprolol  100 mg Oral Daily  . multivitamin  1 tablet Oral QHS  . pantoprazole  40 mg Oral Daily  . potassium chloride  20 mEq Oral Daily  . sodium chloride flush  3 mL Intravenous Q12H  . sodium chloride flush  3 mL Intravenous Q12H   sodium chloride, acetaminophen, nitroGLYCERIN, ondansetron (ZOFRAN) IV, simethicone, sodium chloride flush  Assessment/ Plan:  Mr. Todd Barr is a 73 y.o. white male with end stage renal disease on peritoneal dialysis, hypertension, coronary artery disease status post stents, hyperlipidemia,  Diabetes mellitus type II, who presents to [facility] on 11/30/2018 for Unstable angina pectoris ([place]) [I20.0]  [facility] 93.5 kg CCPD 8 hours 2.3 liter fills 4 exchanges with last fill of 1.2 liters of icodextran.   1. End Stage Renal Disease: on peritoneal dialysis treatment last night.  Tolerated treatment well. No ultrafiltration.  - Holding icotextran  2. Hypokalemia - IV and PO potassium replacement - Replace magnesium - Liberalized diet    3. Hypertension:  Elevated. Home regimen of amlodipine, hydralazine, isosorbide mononitrate, furosemide, losartan and metoprolol.   4. Anemia of chronic kidney disease: hemoglobin 7.3. Gets outpatient EPO.  Transfusion as per cardiology.   5. Secondary Hyperparathyroidism: outpatient labs reviewed.    LOS: 2 Katrena Stehlin 8/20/20208:32 AM

## 2018-12-08 LAB — GLUCOSE, CAPILLARY: Glucose-Capillary: 283 mg/dL — ABNORMAL HIGH (ref 70–99)

## 2018-12-10 ENCOUNTER — Emergency Department
Admission: EM | Admit: 2018-12-10 | Discharge: 2018-12-10 | Disposition: A | Discharge status: Home / Self Care | Payer: No Typology Code available for payment source | Attending: Emergency Medicine | Admitting: Emergency Medicine

## 2018-12-10 ENCOUNTER — Other Ambulatory Visit: Payer: Self-pay

## 2018-12-10 ENCOUNTER — Encounter: Payer: Self-pay | Admitting: *Deleted

## 2018-12-10 DIAGNOSIS — R0602 Shortness of breath: Secondary | ICD-10-CM | POA: Diagnosis present

## 2018-12-10 DIAGNOSIS — Z5321 Procedure and treatment not carried out due to patient leaving prior to being seen by health care provider: Secondary | ICD-10-CM | POA: Insufficient documentation

## 2018-12-10 NOTE — ED Triage Notes (Signed)
Patient c/o shortness of breath from increased fluid that was not removed with home dialysis. Patient went to a dialysis center for further treatment today and reports that the staff "blew his veins."  Patient states he was told by his doctor to come here to have a central line placed for dialysis.

## 2018-12-10 NOTE — ED Notes (Signed)
Pt to stat desk at this time stating that he would rather go home at this time and call his primary. Pt advised against leaving at this time by this RN. CN Lea informed and pt offered bed. Pt still stating that he wants to lave at this time. Pt ambulatory out of ED with NAD at this time.
